# Patient Record
Sex: Female | Born: 1965 | Race: White | Hispanic: No | Marital: Married | Smoking: Current every day smoker
Health system: Southern US, Community
[De-identification: ages and names within clinical notes are randomized; demographics above are authoritative.]

## PROBLEM LIST (undated history)

## (undated) DIAGNOSIS — I2699 Other pulmonary embolism without acute cor pulmonale: Secondary | ICD-10-CM

## (undated) DIAGNOSIS — T7840XA Allergy, unspecified, initial encounter: Secondary | ICD-10-CM

## (undated) DIAGNOSIS — F17200 Nicotine dependence, unspecified, uncomplicated: Secondary | ICD-10-CM

## (undated) DIAGNOSIS — Z8742 Personal history of other diseases of the female genital tract: Secondary | ICD-10-CM

## (undated) DIAGNOSIS — J019 Acute sinusitis, unspecified: Secondary | ICD-10-CM

## (undated) DIAGNOSIS — K219 Gastro-esophageal reflux disease without esophagitis: Secondary | ICD-10-CM

## (undated) DIAGNOSIS — J309 Allergic rhinitis, unspecified: Secondary | ICD-10-CM

## (undated) HISTORY — DX: Gastro-esophageal reflux disease without esophagitis: K21.9

## (undated) HISTORY — DX: Allergy, unspecified, initial encounter: T78.40XA

## (undated) HISTORY — PX: TUBAL LIGATION: SHX77

## (undated) HISTORY — DX: Allergic rhinitis, unspecified: J30.9

## (undated) HISTORY — DX: Nicotine dependence, unspecified, uncomplicated: F17.200

## (undated) HISTORY — DX: Acute sinusitis, unspecified: J01.90

## (undated) HISTORY — DX: Other pulmonary embolism without acute cor pulmonale: I26.99

## (undated) HISTORY — DX: Personal history of other diseases of the female genital tract: Z87.42

---

## 1999-01-10 ENCOUNTER — Ambulatory Visit (HOSPITAL_COMMUNITY): Admission: RE | Admit: 1999-01-10 | Discharge: 1999-01-10 | Payer: Self-pay | Admitting: Gynecology

## 1999-08-27 ENCOUNTER — Other Ambulatory Visit: Admission: RE | Admit: 1999-08-27 | Discharge: 1999-08-27 | Payer: Self-pay | Admitting: Gynecology

## 2000-09-07 ENCOUNTER — Other Ambulatory Visit: Admission: RE | Admit: 2000-09-07 | Discharge: 2000-09-07 | Payer: Self-pay | Admitting: Gynecology

## 2001-09-29 ENCOUNTER — Other Ambulatory Visit: Admission: RE | Admit: 2001-09-29 | Discharge: 2001-09-29 | Payer: Self-pay | Admitting: Gynecology

## 2001-10-04 ENCOUNTER — Encounter: Admission: RE | Admit: 2001-10-04 | Discharge: 2001-10-04 | Payer: Self-pay | Admitting: Gynecology

## 2001-10-04 ENCOUNTER — Encounter: Payer: Self-pay | Admitting: Gynecology

## 2002-02-09 HISTORY — PX: TONSILLECTOMY: SUR1361

## 2002-02-09 HISTORY — PX: APPENDECTOMY: SHX54

## 2003-06-21 ENCOUNTER — Encounter (INDEPENDENT_AMBULATORY_CARE_PROVIDER_SITE_OTHER): Payer: Self-pay | Admitting: *Deleted

## 2003-06-21 ENCOUNTER — Inpatient Hospital Stay (HOSPITAL_COMMUNITY): Admission: EM | Admit: 2003-06-21 | Discharge: 2003-06-23 | Payer: Self-pay | Admitting: Emergency Medicine

## 2003-08-01 ENCOUNTER — Other Ambulatory Visit: Admission: RE | Admit: 2003-08-01 | Discharge: 2003-08-01 | Payer: Self-pay | Admitting: Gynecology

## 2004-09-09 ENCOUNTER — Other Ambulatory Visit: Admission: RE | Admit: 2004-09-09 | Discharge: 2004-09-09 | Payer: Self-pay | Admitting: Gynecology

## 2004-09-26 ENCOUNTER — Encounter: Admission: RE | Admit: 2004-09-26 | Discharge: 2004-09-26 | Payer: Self-pay | Admitting: Gynecology

## 2005-09-09 ENCOUNTER — Encounter: Payer: Self-pay | Admitting: Family Medicine

## 2005-09-09 LAB — CONVERTED CEMR LAB: Pap Smear: NORMAL

## 2005-09-28 ENCOUNTER — Other Ambulatory Visit: Admission: RE | Admit: 2005-09-28 | Discharge: 2005-09-28 | Payer: Self-pay | Admitting: Gynecology

## 2005-10-28 ENCOUNTER — Encounter: Admission: RE | Admit: 2005-10-28 | Discharge: 2005-10-28 | Payer: Self-pay | Admitting: Gynecology

## 2005-11-30 ENCOUNTER — Ambulatory Visit: Payer: Self-pay | Admitting: Family Medicine

## 2006-02-10 ENCOUNTER — Ambulatory Visit: Payer: Self-pay | Admitting: Family Medicine

## 2007-03-09 ENCOUNTER — Ambulatory Visit: Payer: Self-pay | Admitting: Family Medicine

## 2007-03-10 ENCOUNTER — Encounter: Payer: Self-pay | Admitting: Family Medicine

## 2007-03-10 DIAGNOSIS — L239 Allergic contact dermatitis, unspecified cause: Secondary | ICD-10-CM | POA: Insufficient documentation

## 2007-03-10 DIAGNOSIS — Z8742 Personal history of other diseases of the female genital tract: Secondary | ICD-10-CM | POA: Insufficient documentation

## 2007-03-22 ENCOUNTER — Telehealth: Payer: Self-pay | Admitting: Family Medicine

## 2007-07-11 ENCOUNTER — Encounter: Admission: RE | Admit: 2007-07-11 | Discharge: 2007-07-11 | Payer: Self-pay | Admitting: Gynecology

## 2008-11-13 ENCOUNTER — Ambulatory Visit: Payer: Self-pay | Admitting: Family Medicine

## 2008-11-29 ENCOUNTER — Encounter: Admission: RE | Admit: 2008-11-29 | Discharge: 2008-11-29 | Payer: Self-pay | Admitting: Gynecology

## 2008-11-29 IMAGING — MG MM DIAGNOSTIC BILATERAL
5 series · 5 of 5 positions shown · non-contrast
Comparison: Previous examinations.

CLINICAL DATA: Enlarging mass felt by the patient in the upper
outer left breast.  She reports that this started out as a smaller
mass in [8A].

DIGITAL DIAGNOSTIC  BILATERAL  MAMMOGRAM  WITH CAD AND LEFT BREAST
ULTRASOUND:

[R CC]
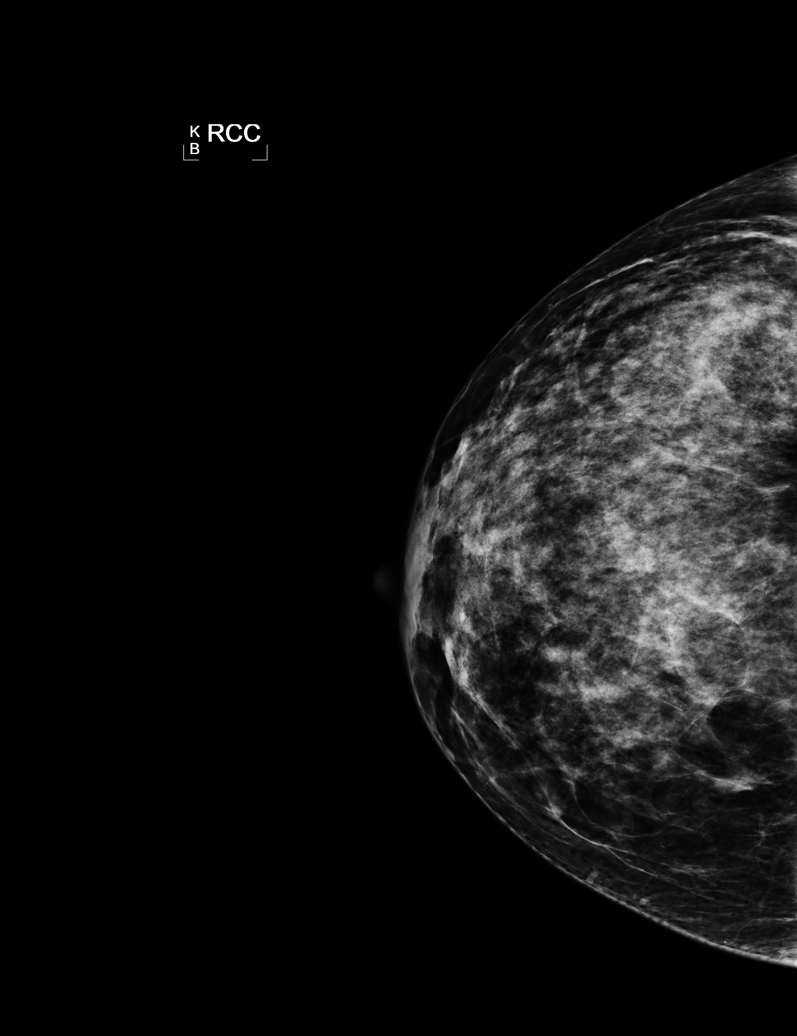

[L CC]
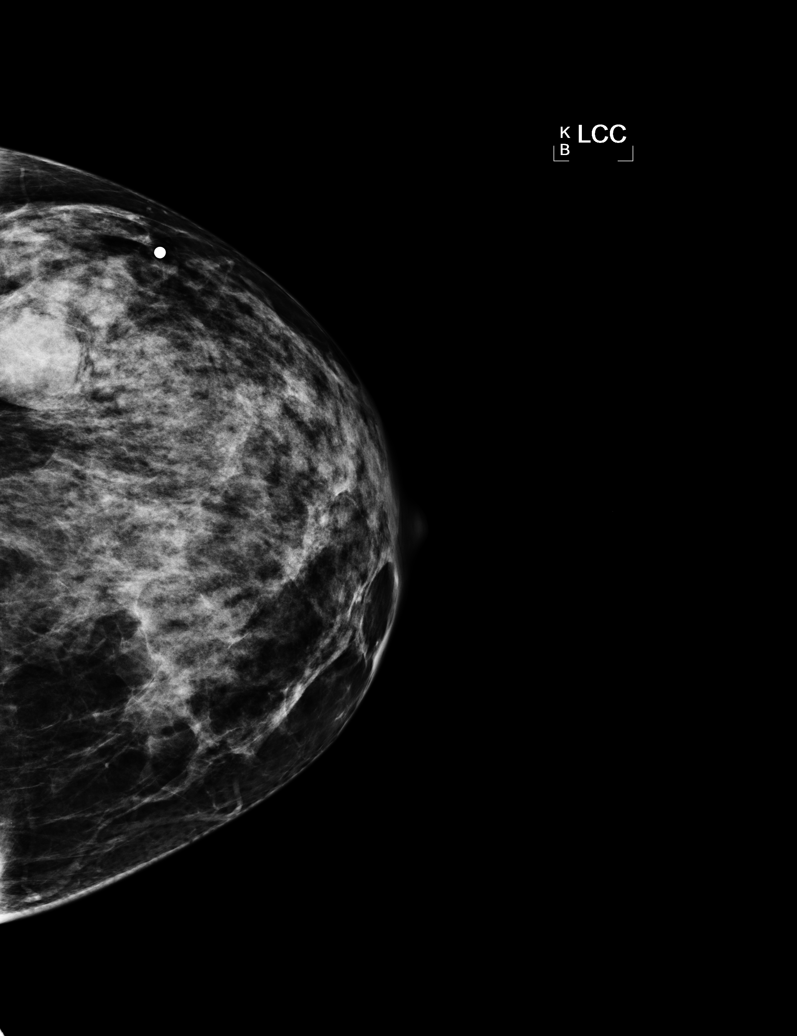

[L MLO]
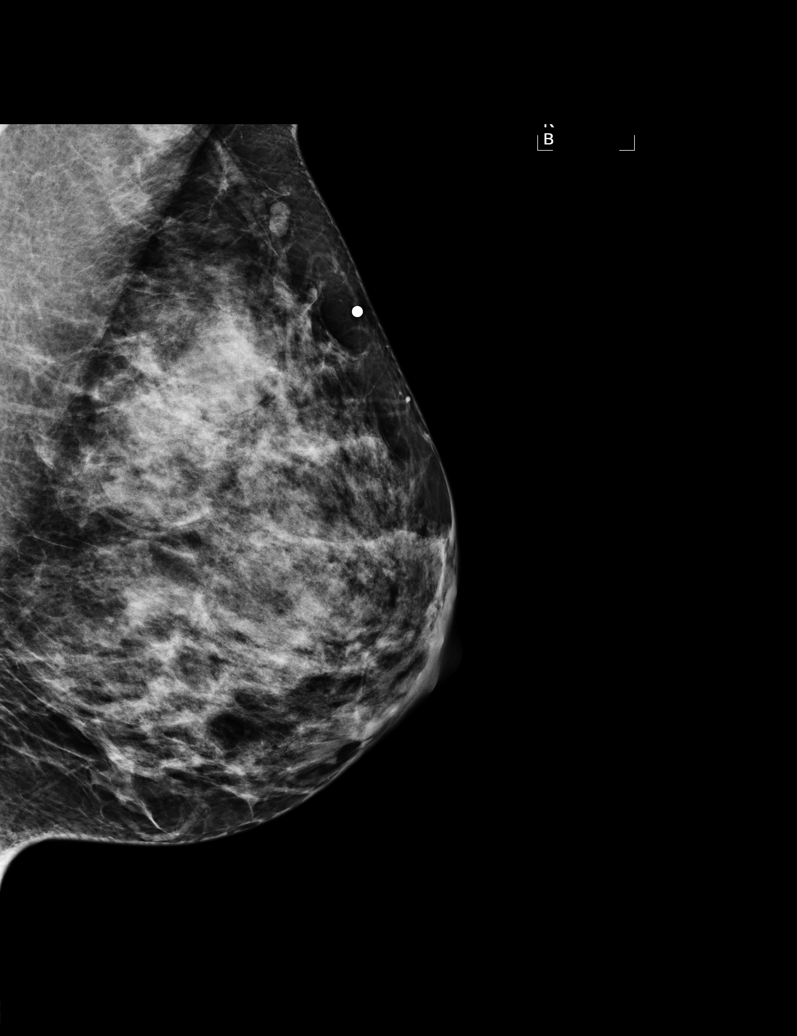

[R MLO]
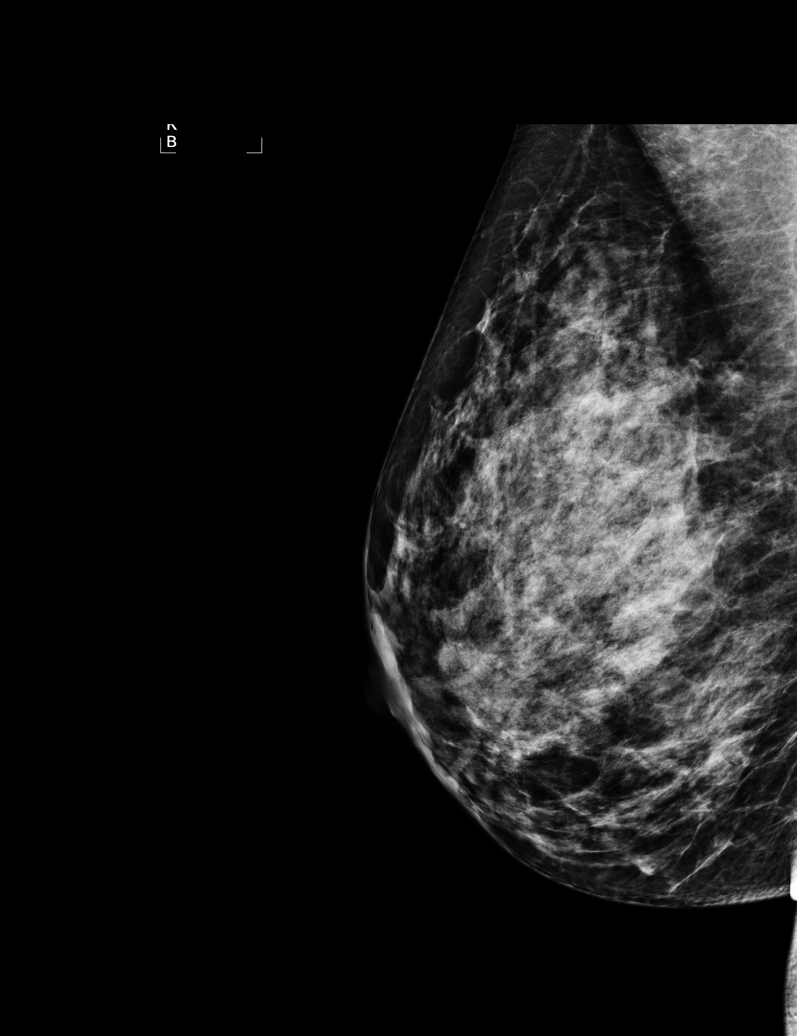

[L TAN]
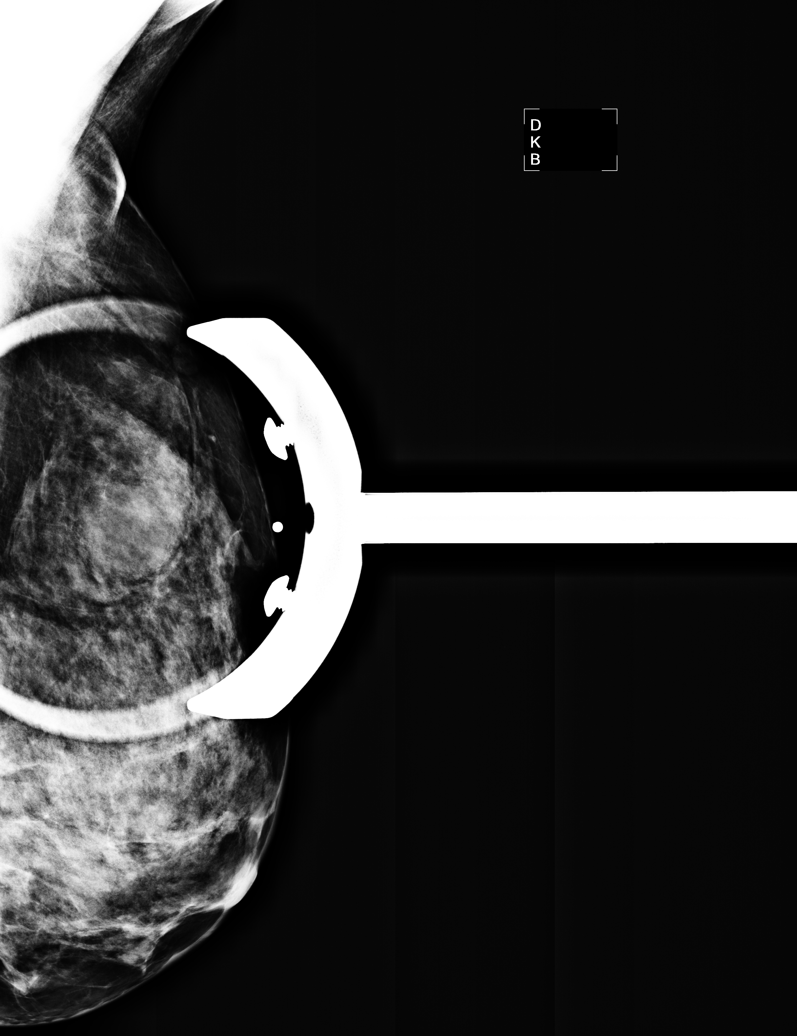

[5 of 5 positions shown; findings below may reference images not displayed]

FINDINGS: Stable heterogeneously dense parenchyma in both breasts.
Partially obscured rounded mass in the upper outer left breast,
corresponding to the palpable mass, marked with a metallic marker.
No findings suspicious for malignancy elsewhere in either breast.
Mammographic images were processed with CAD.

On physical exam, a palpable mass is confirmed in the upper outer
left breast.

Ultrasound is performed, showing a 2.3 x 1.8 x 1.4 cm cyst in the
1:30 o'clock position of the left breast, 7 cm from the nipple.
This corresponds to the palpable mass.
IMPRESSION: Left breast cyst.  No evidence of malignancy.  Screening
mammography is recommended in 1 year.

BI-RADS CATEGORY 2:  Benign finding(s).

## 2009-04-24 ENCOUNTER — Ambulatory Visit: Payer: Self-pay | Admitting: Family Medicine

## 2009-06-14 ENCOUNTER — Ambulatory Visit: Payer: Self-pay | Admitting: Family Medicine

## 2009-10-08 ENCOUNTER — Ambulatory Visit: Payer: Self-pay | Admitting: Family Medicine

## 2009-10-17 ENCOUNTER — Telehealth: Payer: Self-pay | Admitting: Family Medicine

## 2009-10-25 ENCOUNTER — Encounter: Payer: Self-pay | Admitting: Family Medicine

## 2009-11-19 ENCOUNTER — Ambulatory Visit: Payer: Self-pay | Admitting: Family Medicine

## 2009-11-25 ENCOUNTER — Encounter: Payer: Self-pay | Admitting: Family Medicine

## 2009-12-02 ENCOUNTER — Encounter: Admission: RE | Admit: 2009-12-02 | Discharge: 2009-12-02 | Payer: Self-pay | Admitting: Gynecology

## 2009-12-04 ENCOUNTER — Encounter: Payer: Self-pay | Admitting: Family Medicine

## 2009-12-16 ENCOUNTER — Inpatient Hospital Stay (HOSPITAL_COMMUNITY): Admission: EM | Admit: 2009-12-16 | Discharge: 2009-12-20 | Payer: Self-pay | Admitting: Emergency Medicine

## 2009-12-17 ENCOUNTER — Encounter (INDEPENDENT_AMBULATORY_CARE_PROVIDER_SITE_OTHER): Payer: Self-pay | Admitting: Internal Medicine

## 2009-12-23 ENCOUNTER — Encounter (INDEPENDENT_AMBULATORY_CARE_PROVIDER_SITE_OTHER): Payer: Self-pay | Admitting: *Deleted

## 2009-12-23 ENCOUNTER — Ambulatory Visit: Payer: Self-pay | Admitting: Family Medicine

## 2009-12-23 DIAGNOSIS — J13 Pneumonia due to Streptococcus pneumoniae: Secondary | ICD-10-CM | POA: Insufficient documentation

## 2009-12-23 DIAGNOSIS — Z86711 Personal history of pulmonary embolism: Secondary | ICD-10-CM | POA: Insufficient documentation

## 2009-12-23 LAB — CONVERTED CEMR LAB: Prothrombin Time: 42.4 s

## 2009-12-24 ENCOUNTER — Encounter: Payer: Self-pay | Admitting: Family Medicine

## 2009-12-26 ENCOUNTER — Ambulatory Visit: Payer: Self-pay | Admitting: Family Medicine

## 2009-12-26 LAB — CONVERTED CEMR LAB: Prothrombin Time: 39.3 s

## 2009-12-31 ENCOUNTER — Ambulatory Visit: Payer: Self-pay | Admitting: Family Medicine

## 2010-01-01 ENCOUNTER — Telehealth: Payer: Self-pay | Admitting: Family Medicine

## 2010-01-06 ENCOUNTER — Ambulatory Visit: Payer: Self-pay | Admitting: Family Medicine

## 2010-01-06 LAB — CONVERTED CEMR LAB: Prothrombin Time: 21.7 s

## 2010-01-10 ENCOUNTER — Ambulatory Visit: Payer: Self-pay | Admitting: Family Medicine

## 2010-01-10 LAB — CONVERTED CEMR LAB
INR: 1.6
Prothrombin Time: 19.2 s

## 2010-01-13 ENCOUNTER — Ambulatory Visit: Payer: Self-pay | Admitting: Family Medicine

## 2010-01-13 LAB — CONVERTED CEMR LAB
INR: 1.7
Prothrombin Time: 20.9 s

## 2010-01-14 ENCOUNTER — Encounter: Payer: Self-pay | Admitting: Family Medicine

## 2010-01-16 ENCOUNTER — Ambulatory Visit: Payer: Self-pay | Admitting: Family Medicine

## 2010-01-16 LAB — CONVERTED CEMR LAB
INR: 2.3
Prothrombin Time: 27.1 s

## 2010-01-21 ENCOUNTER — Ambulatory Visit: Payer: Self-pay | Admitting: Family Medicine

## 2010-01-21 ENCOUNTER — Encounter (INDEPENDENT_AMBULATORY_CARE_PROVIDER_SITE_OTHER): Payer: Self-pay | Admitting: *Deleted

## 2010-01-21 ENCOUNTER — Telehealth: Payer: Self-pay | Admitting: Family Medicine

## 2010-01-21 LAB — CONVERTED CEMR LAB: INR: 2.8

## 2010-02-04 ENCOUNTER — Ambulatory Visit: Payer: Self-pay | Admitting: Family Medicine

## 2010-02-13 ENCOUNTER — Ambulatory Visit
Admission: RE | Admit: 2010-02-13 | Discharge: 2010-02-13 | Payer: Self-pay | Source: Home / Self Care | Attending: Family Medicine | Admitting: Family Medicine

## 2010-02-20 ENCOUNTER — Ambulatory Visit
Admission: RE | Admit: 2010-02-20 | Discharge: 2010-02-20 | Payer: Self-pay | Source: Home / Self Care | Attending: Family Medicine | Admitting: Family Medicine

## 2010-02-20 LAB — CONVERTED CEMR LAB: Prothrombin Time: 24.5 s

## 2010-03-06 ENCOUNTER — Ambulatory Visit
Admission: RE | Admit: 2010-03-06 | Discharge: 2010-03-06 | Payer: Self-pay | Source: Home / Self Care | Attending: Family Medicine | Admitting: Family Medicine

## 2010-03-06 LAB — CONVERTED CEMR LAB: INR: 2.3

## 2010-03-13 NOTE — Progress Notes (Signed)
Summary: wants referral for pt  Phone Note Call from Patient Call back at Home Phone [phone]   Caller: Patient Call For: Dr. Para March  Summary of Call: Patient says that she is still having pain in her right arm and having tingling in her hands. She is asking if you would do referral for Physical Therapy at this time. Patient prefers [facility] and will need am appt.  Initial call taken by: Melody Comas,  October 17, 2009 10:24 AM  Follow-up for Phone Call        done Follow-up by: Crawford Givens MD,  October 17, 2009 6:06 PM

## 2010-03-13 NOTE — Miscellaneous (Signed)
Summary: D/C summary/[facility]  D/C summary/[facility]   Imported By: Lester Bruceton 01/01/2010 12:34:53  _____________________________________________________________________  External Attachment:    Type:   Image     Comment:   External Document

## 2010-03-13 NOTE — Letter (Signed)
Summary: Out of Work  [facility]  [address] [place], [place] [address]   Phone: [phone]  Fax: [phone]    December 23, 2009   Employee:  CHASTA DESHPANDE    To Whom It May Concern:   For Medical reasons, please excuse the above named employee from work for the following dates:  Please extended sick leave for 1 week until next appt until cleared by Primary care phyiscan    If you need additional information, please feel free to contact our office.         Sincerely,    [name] MD

## 2010-03-13 NOTE — Medication Information (Signed)
Summary: Order for Oxygen Concentrator/[facility]  Order for Oxygen Concentrator/[facility]   Imported By: Maryln Gottron 01/20/2010 13:58:35  _____________________________________________________________________  External Attachment:    Type:   Image     Comment:   External Document

## 2010-03-13 NOTE — Miscellaneous (Signed)
Summary: Order for PT/[facility]  Order for PT/[facility]   Imported By: Sherian Rein 11/01/2009 08:35:04  _____________________________________________________________________  External Attachment:    Type:   Image     Comment:   External Document

## 2010-03-13 NOTE — Progress Notes (Signed)
Summary: ok to take immodium?  Phone Note Call from Patient Call back at Home Phone [phone]   Caller: Patient Summary of Call: Pt went on coumadin about 2 weeks ago.  She has had watery diarrhea all day and is asking if ok if she takes immodium. Initial call taken by: Lowella Petties CMA, AAMA,  January 01, 2010 2:31 PM  Follow-up for Phone Call        that should be okay.   if the diarrhea continues or has blood in it, she needs eval.  Follow-up by: Crawford Givens MD,  January 01, 2010 2:46 PM  Additional Follow-up for Phone Call Additional follow up Details #1::        Patient Advised.  Additional Follow-up by: Delilah Shan CMA Duncan Dull),  January 01, 2010 3:42 PM

## 2010-03-13 NOTE — Assessment & Plan Note (Signed)
Summary: PAIN FROM SHOULDER DOWN TO FINGER TIPS/DLO   Vital Signs:  Patient profile:   45 year old female Height:      63.5 inches Weight:      135.25 pounds BMI:     23.67 Temp:     97.9 degrees F oral Pulse rate:   84 / minute Pulse rhythm:   regular BP sitting:   104 / 70  (left arm) Cuff size:   regular  Vitals Entered By: Delilah Shan CMA Duncan Dull) (October 08, 2009 9:14 AM) CC: Pain from shoulder, down arm, into hand on the right side.  Was seen in UC on Saturday and given Cyclobenz./Hydrocod/APAP; and IBP. (on med list)   History of Present Illness: R shoulder and arm pain.  Started with soreness in neck and shoulder about 3 weeks ago.  Pain was worse on Saturday.  On meds below from UC.  No help with muscle relaxers.  Occ tingling in R hand.  Pain meds are helping some.  R handed.  No trauma.  Types for GMAC inurance at home.  Pain reaching for mouse.   No F,C,NAV,rash.    Allergies: 1)  ! Wellbutrin (Bupropion Hcl)  Review of Systems       See HPI.  Otherwise negative.    Physical Exam  General:  A&O in NAD but uncomfortable due to sore neck and shoulder. MMM Neck w/o LA or midline pain but R trap is tender to palpation.  ROM in limited by pain, esp with turning to the right.  shoulder with decrease in ROM due to pain- esp with int/ext rotation.   Sensation intact on R arm.  Normal motor exam from elbow distally.  Normal radial pulse.  able to make composite fist. symm DTRs.    Impression & Recommendations:  Problem # 1:  SHOULDER PAIN, RIGHT (ICD-719.41) I d/w patient re: options.  Her radicular symptoms could originate at the a foramen and cause the distal tingling and muscle tightness.  She has not motor deficit and DTRs are normal.  I would proceed with sterod taper for now.  If not improving, then I would talk to her about physical therapy or imaging and referral.  She agrees.  I see no reason to image now as this would not change mgmt.  She understood and agrees.   Steroid precuations given and understood.  Out of work until pain is resolved or greatly improved.  The following medications were removed from the medication list:    Ibuprofen 800 Mg Tabs (Ibuprofen) .Marland Kitchen... Take 1 tablet by mouth three to four times per day as needed for pain. Her updated medication list for this problem includes:    Cyclobenzaprine Hcl 10 Mg Tabs (Cyclobenzaprine hcl) .Marland Kitchen... Take 1 tablet by mouth three times a day as needed for pain    Hydrocodone-acetaminophen 5-325 Mg Tabs (Hydrocodone-acetaminophen) .Marland Kitchen... Take 1 tablet by mouth every 4 hours as needed for pain  Complete Medication List: 1)  Multivitamins Tabs (Multiple vitamin) .... Take 1 tablet by mouth once a day 2)  Caltrate 600+d 600-400 Mg-unit Tabs (Calcium carbonate-vitamin d) .... Take 1 tablet by mouth once a day 3)  Loestrin 1/20 (21) 1-20 Mg-mcg Tabs (Norethindrone acet-ethinyl est) .... As directed 4)  Fluticasone Propionate 50 Mcg/act Susp (Fluticasone propionate) .... 2 sprays per nostril daily as needed 5)  Cyclobenzaprine Hcl 10 Mg Tabs (Cyclobenzaprine hcl) .... Take 1 tablet by mouth three times a day as needed for pain 6)  Hydrocodone-acetaminophen 5-325  Mg Tabs (Hydrocodone-acetaminophen) .... Take 1 tablet by mouth every 4 hours as needed for pain 7)  Prednisone (pak) 10 Mg Tabs (Prednisone) .... Sterapred ds 12 day dose pack.  take as directed with food  Patient Instructions: 1)  Take the prednisone as directed with food.  Let me know if you aren't improving so we can either set up physical therapy or image your neck.   2)  Out of work as long as typing is affected/limited by pain.  Prescriptions: PREDNISONE (PAK) 10 MG TABS (PREDNISONE) Sterapred DS 12 day dose pack.  take as directed with food  #1 pack x 0   Entered and Authorized by:   Crawford Givens MD   Signed by:   Crawford Givens MD on 10/08/2009   Method used:   Electronically to        [facility]  [address] (retail)       [address]       [place], [place]  [address]       Ph: [phone] or [phone]       Fax: [phone]   RxID:   [id number]   Current Allergies (reviewed today): ! WELLBUTRIN (BUPROPION HCL)

## 2010-03-13 NOTE — Assessment & Plan Note (Signed)
Summary: CHECK PLACE ON RIGHT SIDE/CLE   Vital Signs:  Patient profile:   45 year old female Height:      63.5 inches Weight:      132.2 pounds BMI:     23.13 Temp:     98.2 degrees F oral Pulse rate:   76 / minute Pulse rhythm:   regular BP sitting:   90 / 60  (left arm) Cuff size:   regular  Vitals Entered By: Benny Lennert CMA Duncan Dull) (April 24, 2009 11:53 AM)  History of Present Illness: Chief complaint tick bite on right   Saw tick on right abdomen ..this AM..was not present yesterday. Area mildly tender, minimal redness, no discharge. Applied alcohol.  Feels well otherwise. No jont pain, no nausea, no fever.   HAs tick with her today..nymph deer tick.   Problems Prior to Update: 1)  Diabetes Mellitus, Gestational, Hx of  (ICD-V13.29) 2)  Allergic Rhinitis  (ICD-477.9) 3)  Tobacco User  (ICD-305.1) 4)  Sinusitis- Acute-nos  (ICD-461.9)  Current Medications (verified): 1)  Allegra 180 Mg  Tabs (Fexofenadine Hcl) .... Take 1 Tablet By Mouth Once A Day 2)  Multivitamins   Tabs (Multiple Vitamin) .... Take 1 Tablet By Mouth Once A Day (Sometimes) 3)  Caltrate 600+d 600-400 Mg-Unit  Tabs (Calcium Carbonate-Vitamin D) .... Take 1 Tablet By Mouth Once A Day (Sometimes) 4)  Loestrin 1/20 (21) 1-20 Mg-Mcg Tabs (Norethindrone Acet-Ethinyl Est) .... As Directed  Allergies: 1)  ! Wellbutrin (Bupropion Hcl)  Past History:  Past medical, surgical, family and social histories (including risk factors) reviewed, and no changes noted (except as noted below).  Past Medical History: Reviewed history from 03/10/2007 and no changes required. DIABETES MELLITUS, GESTATIONAL, HX OF (ICD-V13.29) ALLERGIC RHINITIS (ICD-477.9) TOBACCO USER (ICD-305.1) SINUSITIS- ACUTE-NOS (ICD-461.9)    Past Surgical History: Reviewed history from 03/10/2007 and no changes required. 2004    Appendectomy             Tonsillectomy  Family History: Reviewed history from 03/10/2007 and no changes  required. Father: Alive 89, healthy Mother: Alive 67, healthy Siblings: 1 brother healthy, 1 sister healthy DM:  (+) MGM, aunts  Social History: Reviewed history from 03/10/2007 and no changes required. Current Smoker, less than 1 PPD x 10 years Alcohol use-yes, rare, socially, 1 x/month Drug use-no Regular exercise-yes, golf 3 x/week, walks 1 mile a day Marital Status: Married x 21 years Children: 28 son, 82 years old, healthy Occupation: Scientist, product/process development Rep for insurance (stressful) Diet:  3 meals, (+) veggies, no fruit, (+) caffeine (2 cups), (+) diet not _____, some water  ~ 32 oz./day  Physical Exam  General:  overweight appearing femal einNAd Ears:  External ear exam shows no significant lesions or deformities.  Otoscopic examination reveals clear canals, tympanic membranes are intact bilaterally without bulging, retraction, inflammation or discharge. Hearing is grossly normal bilaterally. Nose:  External nasal examination shows no deformity or inflammation. Nasal mucosa are pink and moist without lesions or exudates. Mouth:  Oral mucosa and oropharynx without lesions or exudates.  Teeth in good repair. Neck:  no carotid bruit or thyromegaly no cervical or supraclavicular lymphadenopathy  full ROM in neck Lungs:  Normal respiratory effort, chest expands symmetrically. Lungs are clear to auscultation, no crackles or wheezes. Heart:  Normal rate and regular rhythm. S1 and S2 normal without gallop, murmur, click, rub or other extra sounds. Extremities:  No clubbing, cyanosis, edema, or deformity noted with normal full range of motion of all  joints.   Skin:  left shoulder with 5 cm diameter concentric circles in target formation   Impression & Recommendations:  Problem # 1:  TICK-BORNE FEVER (ICD-066.1) Concerning for LYme disease given headache, joint ache and target skin rash near tick bote. Tick type is lone star..which does not routinely carry Lyme disease. Will eval with labs tagay and  again in 2-3 week. Will treat emperically given high pretest probability of tick borne illness.  Complete Medication List: 1)  Allegra 180 Mg Tabs (Fexofenadine hcl) .... Take 1 tablet by mouth once a day 2)  Multivitamins Tabs (Multiple vitamin) .... Take 1 tablet by mouth once a day (sometimes) 3)  Caltrate 600+d 600-400 Mg-unit Tabs (Calcium carbonate-vitamin d) .... Take 1 tablet by mouth once a day (sometimes) 4)  Loestrin 1/20 (21) 1-20 Mg-mcg Tabs (Norethindrone acet-ethinyl est) .... As directed  Current Allergies (reviewed today): ! WELLBUTRIN (BUPROPION HCL)

## 2010-03-13 NOTE — Letter (Signed)
Summary: Attending Physician Statement,[employer] Levi Aland  Attending Physician [facility] [employer]   Imported By: Beau Fanny 12/27/2009 10:44:19  _____________________________________________________________________  External Attachment:    Type:   Image     Comment:   External Document

## 2010-03-13 NOTE — Progress Notes (Signed)
Summary: oxygen  Phone Note Call from Patient Call back at Home Phone [phone]   Caller: Patient Call For: [name] MD Summary of Call: [facility] and [facility] her [facility]. They told her that you would have to fax them a letter telling them that you have taken her off of the oxygen completely before they will pick up the equipment. The fax number is [phone].  Initial call taken by: Melody Comas,  January 21, 2010 10:05 AM  Follow-up for Phone Call        Please write letter stating this and fax.  Follow-up by: [name] MD,  January 21, 2010 10:35 AM  Additional Follow-up for Phone Call Additional follow up Details #1::        Letter wrote and faxed to home care.Consuello Masse CMA   Additional Follow-up by: Benny Lennert CMA Duncan Dull),  January 21, 2010 10:44 AM

## 2010-03-13 NOTE — Assessment & Plan Note (Signed)
Summary: FLU SHOT/CLE  Nurse Visit   Allergies: 1)  ! Wellbutrin (Bupropion Hcl)  Orders Added: 1)  Admin 1st Vaccine [90471] 2)  Flu Vaccine 68yrs + [16109]   Flu Vaccine Consent Questions     Do you have a history of severe allergic reactions to this vaccine? no    Any prior history of allergic reactions to egg and/or gelatin? no    Do you have a sensitivity to the preservative Thimersol? no    Do you have a past history of Guillan-Barre Syndrome? no    Do you currently have an acute febrile illness? no    Have you ever had a severe reaction to latex? no    Vaccine information given and explained to patient? yes    Are you currently pregnant? no    Lot Number:[id number]   Exp Date:08/09/2010   Site Given  Left Deltoid IM

## 2010-03-13 NOTE — Assessment & Plan Note (Signed)
Summary: ROA 30 MIN  CYD F/U ON PNEUMONIA CYD   Vital Signs:  Patient profile:   45 year old female Height:      63.5 inches Weight:      129.0 pounds BMI:     22.57 O2 Sat:      100 % on 2 L/min Temp:     98.0 degrees F oral Pulse rate:   70 / minute Pulse rhythm:   regular BP sitting:   100 / 60  (left arm) Cuff size:   regular  Vitals Entered By: Benny Lennert CMA Duncan Dull) (January 06, 2010 8:32 AM)  O2 Flow:  2 L/min O2 Sat on room air at rest %:  100 O2 Sat on room air ambulating %:  100  History of Present Illness: Chief complaint follow up pneumonia   Hopsitalize 11/7 to 11/11 DISCHARGE DIAGNOSES: 1. Acute pulmonary embolism. 2. Pneumonia. 3. Fever secondary to pneumonia. 4. Pleuritic chest pain secondary to pulmonary embolism. 5. Leukocytosis secondary to pneumonia. 6. Tobacco abuse. 7. Recent contraceptive use. 8. Thrombocytopenia, resolved most likely secondary to acute pulmonary     embolism. 9. lupus anticoagulant positive..but not clear meaning in setting of acute PE.  ue.    Today she reports she has completed her antibitoic course and has been able to wean off pain medicaiton. She is doing better each day..still some coughing, dry..only pain onright side when taking deep breaths.  She continues on oxygen round the clock. Some fatigue. Able to speak easier now..not very breathless. Did have viral GE.Marland KitchenN/V has resolved now.   On coumadin.Marland KitchenMarland KitchenINR  intially  theraputic, but at last check 1.9 (11/22)..now off antibiotics.  Due for check today.    Problems Prior to Update: 1)  Encounter For Therapeutic Drug Monitoring  (ICD-V58.83) 2)  Long-term (CURRENT) Use of Anticoagulants  (ICD-V58.61) 3)  Pneumonia, Community Acquired, Pneumococcal  (ICD-481) 4)  Pulmonary Embolism  (ICD-415.19) 5)  Diabetes Mellitus, Gestational, Hx of  (ICD-V13.29) 6)  Allergic Rhinitis  (ICD-477.9)  Current Medications (verified): 1)  Warfarin Sodium 5 Mg Tabs (Warfarin  Sodium) .... One Tablet Daily 2)  Oxygen 2 Liters 3)  Warfarin Sodium 2.5 Mg Tabs (Warfarin Sodium) .... Take By Mouth Daily As Directed  Allergies: 1)  ! Wellbutrin (Bupropion Hcl)  Past History:  Past medical, surgical, family and social histories (including risk factors) reviewed, and no changes noted (except as noted below).  Past Medical History: Reviewed history from 03/10/2007 and no changes required. DIABETES MELLITUS, GESTATIONAL, HX OF (ICD-V13.29) ALLERGIC RHINITIS (ICD-477.9) TOBACCO USER (ICD-305.1) SINUSITIS- ACUTE-NOS (ICD-461.9)    Past Surgical History: Reviewed history from 03/10/2007 and no changes required. 2004    Appendectomy             Tonsillectomy  Family History: Reviewed history from 03/10/2007 and no changes required. Father: Alive 38, healthy Mother: Alive 1, healthy Siblings: 1 brother healthy, 1 sister healthy DM:  (+) MGM, aunts  Social History: Reviewed history from 03/10/2007 and no changes required. PAst smoker.Marland KitchenQUIT after PE 12/2009. Alcohol use-yes, rare, socially, 1 x/month Drug use-no Regular exercise-yes, golf 3 x/week, walks 1 mile a day Marital Status: Married x 21 years Children: 69 son, 56 years old, healthy Occupation: Scientist, product/process development Rep for insurance (stressful) Diet:  3 meals, (+) veggies, no fruit, (+) caffeine (2 cups), (+) diet not _____, some water  ~ 32 oz./day  Review of Systems General:  Denies fever. CV:  Denies chest pain or discomfort. Resp:  Complains of pleuritic. GI:  Denies abdominal pain. Derm:  bruising on rear resolvednow.Marland Kitchen Psych:  Denies anxiety and depression.  Physical Exam  General:  healthy appearing femal on oxygen by nasal cannula.Marland Kitchenspeaking in clear full sentences with no SOB> Eyes:  No corneal or conjunctival inflammation noted. EOMI. Perrla. Funduscopic exam benign, without hemorrhages, exudates or papilledema. Vision grossly normal. Ears:  External ear exam shows no significant lesions or  deformities.  Otoscopic examination reveals clear canals, tympanic membranes are intact bilaterally without bulging, retraction, inflammation or discharge. Hearing is grossly normal bilaterally. Nose:  External nasal examination shows no deformity or inflammation. Nasal mucosa are pink and moist without lesions or exudates. Mouth:  Oral mucosa and oropharynx without lesions or exudates.  Teeth in good repair. Neck:  no carotid bruit or thyromegaly no cervical or supraclavicular lymphadenopathy  Lungs:  Normal respiratory effort, chest expands symmetrically. Lungs are clear to auscultation, no crackles or wheezes. Heart:  Normal rate and regular rhythm. S1 and S2 normal without gallop, murmur, click, rub or other extra sounds. Abdomen:  Bowel sounds positive,abdomen soft and non-tender without masses, organomegaly or hernias noted. Pulses:  R and L posterior tibial pulses are full and equal bilaterally  Extremities:  no edema  Skin:  Intact without suspicious lesions or rashes Psych:  Cognition and judgment appear intact. Alert and cooperative with normal attention span and concentration. No apparent delusions, illusions, hallucinations   Impression & Recommendations:  Problem # 1:  PULMONARY EMBOLISM (ICD-415.19) Improving, but some residual pain and SOB. Due for INR   today.Remain on coumadin for 9 months then when off recheck lupus anticoagulant or heme referral. No longer with  oxygen requirement.  Cleared to return to work 11/30.Marland Kitchenstart back on reduced work schedule given work is a lot of talking..she still get SOB with prolongued conversations.   Her updated medication list for this problem includes:    Warfarin Sodium 5 Mg Tabs (Warfarin sodium) ..... One tablet daily    Warfarin Sodium 2.5 Mg Tabs (Warfarin sodium) .Marland Kitchen... Take by mouth daily as directed  Problem # 2:  PNEUMONIA, COMMUNITY ACQUIRED, PNEUMOCOCCAL (ICD-481) Resolved off antibiotics. The following medications were removed  from the medication list:    Avelox 400 Mg Tabs (Moxifloxacin hcl) ..... One tablet by mouth daily  Problem # 3:  CONTUSION, BUTTOCK (ICD-922.32) Resolved.   Complete Medication List: 1)  Warfarin Sodium 5 Mg Tabs (Warfarin sodium) .... One tablet daily 2)  Oxygen 2 Liters  3)  Warfarin Sodium 2.5 Mg Tabs (Warfarin sodium) .... Take by mouth daily as directed  Patient Instructions: 1)  Please schedule a follow-up appointment in 2 weeks.  2)  You are able to stop oxygen at this time..use as needed for shortness of breath and at [facility] for the next few nights. 3)  Return to work on 11/30.Marland Kitchen with restrictions to avoid long phone converstations given some residual shortness of breath. Consider starting back at reduced schedule temporarily for 1-2 days.  4)  FMLA paperwork to be sent shortly.    Orders Added: 1)  Est. Patient Level IV [57846]    Current Allergies (reviewed today): ! WELLBUTRIN (BUPROPION HCL)

## 2010-03-13 NOTE — Assessment & Plan Note (Signed)
Summary: LEFT EAR PAIN/DLO   Vital Signs:  Patient profile:   45 year old female Height:      63.5 inches Weight:      130.38 pounds BMI:     22.82 Temp:     98.1 degrees F oral Pulse rate:   72 / minute Pulse rhythm:   regular BP sitting:   120 / 70  (left arm) Cuff size:   regular  Vitals Entered By: Linde Gillis CMA Duncan Dull) (Jun 14, 2009 3:40 PM) CC: left ear pain   History of Present Illness: Intermittant left ear pain. Feels pressure, nml hearing. Itches left ear canal.  Allergies moderate control...started zyrtec..helped with symptoms except ear.  Use netty Pot.Marland Kitchen left ear popped after this.   No fever.   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 Take 1 Tablet By Mouth Once A Day (Sometimes) 2)  Caltrate 600+d 600-400 Mg-Unit  Tabs (Calcium Carbonate-Vitamin D) .... Take 1 Tablet By Mouth Once A Day (Sometimes) 3)  Loestrin 1/20 (21) 1-20 Mg-Mcg Tabs (Norethindrone Acet-Ethinyl Est) .... As Directed  Allergies: 1)  ! Wellbutrin (Bupropion Hcl)  Past History:  Past medical, surgical, family and social histories (including risk factors) reviewed, and no changes noted (except as noted below).  Past Medical History: Reviewed history from 03/10/2007 and no changes required. DIABETES MELLITUS, GESTATIONAL, HX OF (ICD-V13.29) ALLERGIC RHINITIS (ICD-477.9) TOBACCO USER (ICD-305.1) SINUSITIS- ACUTE-NOS (ICD-461.9)    Past Surgical History: Reviewed history from 03/10/2007 and no changes required. 2004    Appendectomy             Tonsillectomy  Family History: Reviewed history from 03/10/2007 and no changes required. Father: Alive 85, healthy Mother: Alive 70, healthy Siblings: 1 brother healthy, 1 sister  healthy DM:  (+) MGM, aunts  Social History: Reviewed history from 03/10/2007 and no changes required. Current Smoker, less than 1 PPD x 10 years Alcohol use-yes, rare, socially, 1 x/month Drug use-no Regular exercise-yes, golf 3 x/week, walks 1 mile a day Marital Status: Married x 21 years Children: 62 son, 23 years old, healthy Occupation: Scientist, product/process development Rep for insurance (stressful) Diet:  3 meals, (+) veggies, no fruit, (+) caffeine (2 cups), (+) diet not _____, some water  ~ 32 oz./day  Review of Systems General:  Denies fatigue and fever. CV:  Denies chest pain or discomfort. Resp:  Denies shortness of breath.  Physical Exam  General:  Well-developed,well-nourished,in no acute distress; alert,appropriate and cooperative throughout examination Eyes:  No corneal or conjunctival inflammation noted. EOMI. Perrla. Funduscopic exam benign, without hemorrhages, exudates or papilledema. Vision grossly normal. Ears:  small amount clcear fluid B TMs Nose:  External nasal examination shows no deformity or inflammation. Nasal mucosa are pink and moist without lesions or exudates. Mouth:  Oral mucosa and oropharynx without lesions or exudates.  Teeth in good repair. Neck:  no carotid bruit or thyromegaly no cervical or supraclavicular lymphadenopathy  Lungs:  Normal respiratory effort, chest expands symmetrically. Lungs are clear to auscultation, no crackles or wheezes. Heart:  Normal rate and regular rhythm. S1 and S2 normal without gallop, murmur, click, rub or other extra sounds.   Impression & Recommendations:  Problem # 1:  EAR PAIN, LEFT (ICD-388.70) Likely eustacian tube dysfunction  following allergies. Treat with nasal staroid, netty pot irrigation and antihistamine. Call if not improving as expectd.   Complete Medication List: 1)  Multivitamins Tabs (Multiple vitamin) .... Take 1 tablet by mouth once a day (sometimes) 2)  Caltrate 600+d 600-400 Mg-unit Tabs (Calcium carbonate-vitamin  d) .... Take 1 tablet by mouth once a day (sometimes) 3)  Loestrin 1/20 (21) 1-20 Mg-mcg Tabs (Norethindrone acet-ethinyl est) .... As directed 4)  Fluticasone Propionate 50 Mcg/act Susp (Fluticasone propionate) .... 2 sprays per nostril daily  Patient Instructions: 1)  NASal steroid spray 2 sprays per nostril daily x 1-2 weeks then can continue for rest of allergy season at one spray per nostril per day.  2)  Call if symptoms not resolving.  3)  Continue Zyrtec and Government social research officer.  Prescriptions: FLUTICASONE PROPIONATE 50 MCG/ACT SUSP (FLUTICASONE PROPIONATE) 2 sprays per nostril daily  #1 x 0   Entered and Authorized by:   [name] MD   Signed by:   [name] MD on 06/14/2009   Method used:   Electronically to        [facility] [address] (retail)       [address]       [place], [place]  [address]       Ph: [phone]       Fax: [phone]   RxID:   [id number]   Current Allergies (reviewed today): ! WELLBUTRIN (BUPROPION HCL)

## 2010-03-13 NOTE — Assessment & Plan Note (Signed)
Summary: 10:30 F/U [facility]  D/C 12/20/09/CLE   Vital Signs:  Patient profile:   45 year old female Height:      63.5 inches Weight:      133.0 pounds BMI:     23.27 O2 Sat:      98 % on 2 L/min Temp:     99.2 degrees F oral Pulse rate:   107 / minute Pulse rhythm:   regular Resp:     26 per minute BP sitting:   110 / 70  (left arm) Cuff size:   regular  Vitals Entered By: Benny Lennert CMA Duncan Dull) (December 23, 2009 10:28 AM)  O2 Flow:  2 L/min  History of Present Illness: Chief complaint follow up cone discharge   Hopsitalize 11/7 to 11/11 DISCHARGE DIAGNOSES: 1. Acute pulmonary embolism. 2. Pneumonia. 3. Fever secondary to pneumonia. 4. Pleuritic chest pain secondary to pulmonary embolism. 5. Leukocytosis secondary to pneumonia. 6. Tobacco abuse. 7. Recent contraceptive use. 8. Thrombocytopenia, resolved most likely secondary to acute pulmonary     embolism. 9. lupus anticoagulant positive..but not clear meaning in setting of acute PE.   ON lovenox bridge and on coumadin 5 mg daily. LAst INR 1.76 11/11 Cause thought to be due to pneumonia, smoking and OCP use...but lupus anticoag also pos.  Grandmother and uncle with ? DVT, ? PE.  Otherwise anticoag panel neg.   Recommendations to take off coumadin at 9 months and recheck lupus anticoagulant.Marland Kitchenif positive may need lifelong anticoagulation.  Now off OCP (was taking for fibroids per GYN)   Pneumonia, left lower lobe.Marland Kitchenavelox for 11m more days Currently on Oxygen, continuously. On oxycodone for pain... using 10 mg two times a day... using 5 mg intermiediated acting 2-3  a day.  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 One Tablet By Mouth Daily 2)  Warfarin Sodium 5 Mg Tabs (Warfarin Sodium) .... One Tablet Daily 3)  Oxycontin 10 Mg Xr12h-Tab (Oxycodone Hcl) .... One Tablet Every 12 Hours As Needed For Pain 4)  Oxycodone Hcl 5 Mg Tabs (Oxycodone Hcl) .Marland Kitchen.. 1 Tablet Every 4 Hours As Needed For Pain 5)  Oxygen 2 Liters  Allergies: 1)  ! Wellbutrin (Bupropion Hcl)  Past History:  Past medical, surgical, family and social histories (including risk factors) reviewed, and no changes noted (except as noted below).  Past Medical History: Reviewed history from 03/10/2007 and no changes required. DIABETES MELLITUS, GESTATIONAL, HX OF (ICD-V13.29) ALLERGIC RHINITIS (ICD-477.9) TOBACCO USER (ICD-305.1) SINUSITIS- ACUTE-NOS (ICD-461.9)    Past Surgical History: Reviewed history from 03/10/2007 and no changes required. 2004    Appendectomy             Tonsillectomy  Family History: Reviewed history from 03/10/2007 and no changes required. Father: Alive 18, healthy Mother: Alive 29, healthy Siblings: 1 brother healthy, 1 sister healthy DM:  (+) MGM, aunts  Social History: Reviewed history from 03/10/2007 and no changes required. Current Smoker, less than 1 PPD x 10 years Alcohol use-yes, rare, socially, 1 x/month Drug use-no Regular exercise-yes,  golf 3 x/week, walks 1 mile a day Marital Status: Married x 21 years Children: 56 son, 74 years old, healthy Occupation: Claims Rep for insurance (stressful) Diet:  3 meals, (+) veggies, no fruit, (+) caffeine (2 cups), (+) diet not _____, some water  ~ 32 oz./day  Review of Systems       no bleeding..unusual skin changes on low back...bruising  TTP in right SI joint General:  Denies fatigue, fever, and loss of appetite. CV:  Denies chest pain or discomfort. Resp:  Complains of  pleuritic and shortness of breath; denies sputum productive and wheezing; Shortness of breath only with moving..... GI:  Denies bloody stools. GU:  Denies dysuria.  Physical Exam  General:  Non toxic, no respiratory distress.Marland Kitchenable to speak but moderate size sentences Eyes:  No corneal or conjunctival inflammation noted. EOMI. Perrla. Funduscopic exam benign, without hemorrhages, exudates or papilledema. Vision grossly normal. Ears:  External ear exam shows no significant lesions or deformities.  Otoscopic examination reveals clear canals, tympanic membranes are intact bilaterally without bulging, retraction, inflammation or discharge. Hearing is grossly normal bilaterally. Nose:  External nasal examination shows no deformity or inflammation. Nasal mucosa are pink and moist without lesions or exudates. Mouth:  Oral mucosa and oropharynx without lesions or exudates.  Teeth in good repair. Neck:  no carotid bruit or thyromegaly no cervical or supraclavicular lymphadenopathy  Lungs:  decreased breath sounds in right base, mildly tachypnic Heart:  Normal rate and regular rhythm. S1 and S2 normal without gallop, murmur, click, rub or other extra sounds. Abdomen:  Bowel sounds positive,abdomen soft and non-tender without masses, organomegaly or hernias noted. Msk:  ttp in right SI joint..no hematoma palpated Pulses:  R and L posterior tibial pulses are full and equal bilaterally  Extremities:  no edmea Skin:  mottled bruising in tailbone buttock area. Psych:  Cognition and judgment appear intact. Alert and cooperative with normal attention span and concentration. No apparent delusions, illusions, hallucinations   Impression & Recommendations:  Problem # 1:  PNEUMONIA, COMMUNITY ACQUIRED, PNEUMOCOCCAL (ICD-481) Complete course of antibitoics. Remains O2 dependant. Wean as able per [facility].   Close follow up in 1 week.  Her updated medication list for this problem includes:    Avelox 400 Mg Tabs  (Moxifloxacin hcl) ..... One tablet by mouth daily  Problem # 2:  PULMONARY EMBOLISM (ICD-415.19) Eval PT today to determine if we can D/C Lovenow. Will remain on coumadin x 9 months, then will D/C and recehck lupus anticoagulant to determine if true positive.  Other clear reasons for PE are...OCPs, smoking, penumoni/decreased activity. Her updated medication list for this problem includes:    Warfarin Sodium 5 Mg Tabs (Warfarin sodium) ..... One tablet daily    Warfarin Sodium 2.5 Mg Tabs (Warfarin sodium) .Marland Kitchen... Take by mouth daily as directed  Orders: Protime (84132GM)  Problem # 3:  CONTUSION, BUTTOCK (ICD-922.32) Vs SI joint pain from recent deconditionating. Start with gentle stretching, heat. MAy use donut cushion. Call if pain not improving.   Complete Medication List: 1)  Avelox 400 Mg Tabs (Moxifloxacin hcl) .... One tablet by mouth daily 2)  Warfarin Sodium 5 Mg Tabs (Warfarin sodium) .... One tablet daily 3)  Oxycontin 10 Mg Xr12h-tab (Oxycodone hcl) .... One tablet every 12 hours as needed for pain 4)  Oxycodone Hcl 5 Mg Tabs (Oxycodone hcl) .Marland Kitchen.. 1 tablet every 4 hours as needed for pain 5)  Oxygen 2 Liters  6)  Warfarin Sodium 2.5 Mg Tabs (Warfarin sodium) .Marland KitchenMarland KitchenMarland Kitchen  Take by mouth daily as directed  Patient Instructions: 1)  Follow up in 1 week follow up oneumonia and PE. Will check INR at that time.  Prescriptions: WARFARIN SODIUM 2.5 MG TABS (WARFARIN SODIUM) take by mouth daily as directed  #90 x 2   Entered by:   Liane Comber CMA (AAMA)   Authorized by:   [name] MD   Signed by:   Liane Comber CMA (AAMA) on 12/26/2009   Method used:   Electronically to        [facility] [address] (retail)       [address], [address]       [place], [place]  [address]       Ph: [phone]       Fax: [phone]   RxID:   [id number] WARFARIN SODIUM 5 MG TABS (WARFARIN SODIUM) one tablet daily  #30 x 5   Entered by:   Benny Lennert CMA  (AAMA)   Authorized by:   [name] MD   Signed by:   Benny Lennert CMA (AAMA) on 12/23/2009   Method used:   Electronically to        [facility] [address] (retail)       [address], [address]       [place], [place]  [address]       Ph: [phone]       Fax: [phone]   RxID:   [id number]    Orders Added: 1)  Est. Patient Level I [99211] 2)  Protime [88416SA] 3)  Est. Patient Level IV [63016]    Current Allergies (reviewed today): ! WELLBUTRIN (BUPROPION HCL)  Laboratory Results   Blood Tests   Date/Time Recieved: December 23, 2009 11:44 AM  Date/Time Reported: December 23, 2009 11:44 AM   PT: 42.4 s   (Normal Range: 10.6-13.4)  INR: 3.5   (Normal Range: 0.88-1.12   Therap INR: 2.0-3.5)      ANTICOAGULATION RECORD  NEW REGIMEN & LAB RESULTS Anticoag. Dx: PE Current INR Goal Range: 2.5-3.5 Current INR: 3.5 Current Coumadin Dose(mg): 5mg  qd Regimen: 2.5/5/2.5 Coagulation Comments: DC'D Lovenox injections Kiegan Macaraeg: bedsole      Repeat testing in: 3days MEDICATIONS AVELOX 400 MG TABS (MOXIFLOXACIN HCL) one tablet by mouth daily WARFARIN SODIUM 5 MG TABS (WARFARIN SODIUM) one tablet daily OXYCONTIN 10 MG XR12H-TAB (OXYCODONE HCL) one tablet every 12 hours as needed for pain OXYCODONE HCL 5 MG TABS (OXYCODONE HCL) 1 tablet every 4 hours as needed for pain * OXYGEN 2 LITERS  WARFARIN SODIUM 2.5 MG TABS (WARFARIN SODIUM) take by mouth daily as directed  Dose has been reviewed with patient or caretaker during this visit.  Reviewed by: Allison Quarry  Anticoagulation Visit Questionnaire      Coumadin dose missed/changed:  No      Abnormal Bleeding Symptoms:  No Any diet changes including alcohol intake, vegetables or greens since the last visit:  No Any illnesses or hospitalizations since the last visit:  Yes      Recent Illness/Hospitalizations:  New PE x 1 week ago Any signs of clotting since the last visit (including  chest discomfort, dizziness, shortness of breath, arm tingling, slurred speech, swelling or redness in leg):  No

## 2010-03-13 NOTE — Letter (Signed)
Summary: Annual Exam/Dr. Beather Arbour  Annual Exam/Dr. Beather Arbour   Imported By: Maryln Gottron 12/11/2009 13:45:09  _____________________________________________________________________  External Attachment:    Type:   Image     Comment:   External Document  Appended Document: Orders Update    Clinical Lists Changes  Observations: Added new observation of FLUVAXDUE: 11/20/2010 (12/13/2009 14:03) Added new observation of PAP DUE: 12/05/2010 (12/13/2009 14:03) Added new observation of MAMMO DUE: 02/09/2006 (12/13/2009 14:03) Added new observation of LAST PAP DAT: 12/04/2009 (12/04/2009 14:03) Added new observation of PAP SMEAR: normal at GYN (12/04/2009 14:03)      Last PAP:  Normal (09/09/2005 2:43:37 PM) PAP Result Date:  12/04/2009 PAP Result:  normal at GYN PAP Next Due:  1 yr [name] MD  December 13, 2009 2:03 PM

## 2010-03-13 NOTE — Letter (Signed)
Summary: Generic Letter  [facility]  [address] [place], [place] [address]   Phone: [phone]  Fax: [phone]    01/21/2010  Anita White [address] [place], [place]  [address]    To Whom It May Concern:    The above named patient oxygen therapy has been discontinued as of December 13th 2011.       Sincerely,   [name] MD

## 2010-03-13 NOTE — Assessment & Plan Note (Signed)
Summary: FOLLOW-UP/JRR   Vital Signs:  Patient profile:   45 year old female Height:      63.5 inches Weight:      133.4 pounds BMI:     23.34 Temp:     98.2 degrees F oral Pulse rate:   72 / minute Pulse rhythm:   regular BP sitting:   100 / 60  (left arm) Cuff size:   regular  Vitals Entered By: Benny Lennert CMA Duncan Dull) (January 21, 2010 9:06 AM)  History of Present Illness: Chief complaint follow up   S/P resolved pneumonia and PE... on coumadin for  9 months.  No longer oxygen dependant... occ uses Oxygen at noight..but often forgets.  Back at work and doing well... able to talk on the phone.   Energy back.. no chest pain. No leg pain. Still not smoking.. now at 5 weeks.     Allergies: 1)  ! Wellbutrin (Bupropion Hcl)  Past History:  Past medical, surgical, family and social histories (including risk factors) reviewed, and no changes noted (except as noted below).  Past Medical History: Reviewed history from 03/10/2007 and no changes required. DIABETES MELLITUS, GESTATIONAL, HX OF (ICD-V13.29) ALLERGIC RHINITIS (ICD-477.9) TOBACCO USER (ICD-305.1) SINUSITIS- ACUTE-NOS (ICD-461.9)    Past Surgical History: Reviewed history from 03/10/2007 and no changes required. 2004    Appendectomy             Tonsillectomy  Family History: Reviewed history from 03/10/2007 and no changes required. Father: Alive 8, healthy Mother: Alive 48, healthy Siblings: 1 brother healthy, 1 sister healthy DM:  (+) MGM, aunts  Social History: Reviewed history from 01/06/2010 and no changes required. PAst smoker.Marland KitchenQUIT after PE 12/2009. Alcohol use-yes, rare, socially, 1 x/month Drug use-no Regular exercise-yes, golf 3 x/week, walks 1 mile a day Marital Status: Married x 21 years Children: 40 son, 51 years old, healthy Occupation: Scientist, product/process development Rep for insurance (stressful) Diet:  3 meals, (+) veggies, no fruit, (+) caffeine (2 cups), (+) diet not _____, some water  ~ 32  oz./day  Review of Systems General:  Denies fatigue and fever. CV:  Denies chest pain or discomfort. Resp:  Denies shortness of breath.  Physical Exam  General:  Well-developed,well-nourished,in no acute distress; alert,appropriate and cooperative throughout examination Nose:  External nasal examination shows no deformity or inflammation. Nasal mucosa are pink and moist without lesions or exudates. Mouth:  Oral mucosa and oropharynx without lesions or exudates.  Teeth in good repair. Neck:  No deformities, masses, or tenderness noted. no cervical or supraclavicular lymphadenopathy  Lungs:  Normal respiratory effort, chest expands symmetrically. Lungs are clear to auscultation, no crackles or wheezes. Heart:  Normal rate and regular rhythm. S1 and S2 normal without gallop, murmur, click, rub or other extra sounds. Pulses:  R and L posterior tibial pulses are full and equal bilaterally  Extremities:  no edema    Impression & Recommendations:  Problem # 1:  PULMONARY EMBOLISM (ICD-415.19) Assessment Improved Oxygenating well.  D/C oxygen. On coumadin for 9 months total... follow up in 8 months to D/C then to check lupus anticoag.  Her updated medication list for this problem includes:    Warfarin Sodium 5 Mg Tabs (Warfarin sodium) ..... One tablet daily    Warfarin Sodium 2.5 Mg Tabs (Warfarin sodium) .Marland Kitchen... Take by mouth daily as directed  Problem # 2:  PNEUMONIA, COMMUNITY ACQUIRED, PNEUMOCOCCAL (ICD-481) Assessment: Improved resolved.  Complete Medication List: 1)  Warfarin Sodium 5 Mg Tabs (Warfarin sodium) .... One tablet  daily 2)  Warfarin Sodium 2.5 Mg Tabs (Warfarin sodium) .... Take by mouth daily as directed  Patient Instructions: 1)  Look into when last tetanus done. 2)  Follow up appt in 8 months for coumadin cessation discussion.    Orders Added: 1)  Est. Patient Level III [[address]]    Current Allergies (reviewed today): ! WELLBUTRIN (BUPROPION HCL)  Last  Mammogram:  Normal (02/09/2005 2:43:07 PM) Mammogram Result Date:  12/04/2009 Mammogram Result:  normal Mammogram Next Due:  1 yr

## 2010-03-20 ENCOUNTER — Encounter: Payer: Self-pay | Admitting: Family Medicine

## 2010-03-20 ENCOUNTER — Ambulatory Visit (INDEPENDENT_AMBULATORY_CARE_PROVIDER_SITE_OTHER): Payer: 59

## 2010-03-20 DIAGNOSIS — Z7901 Long term (current) use of anticoagulants: Secondary | ICD-10-CM

## 2010-03-20 DIAGNOSIS — I2699 Other pulmonary embolism without acute cor pulmonale: Secondary | ICD-10-CM

## 2010-03-20 DIAGNOSIS — Z5181 Encounter for therapeutic drug level monitoring: Secondary | ICD-10-CM

## 2010-04-18 ENCOUNTER — Ambulatory Visit (INDEPENDENT_AMBULATORY_CARE_PROVIDER_SITE_OTHER): Payer: 59

## 2010-04-18 ENCOUNTER — Encounter: Payer: Self-pay | Admitting: Family Medicine

## 2010-04-18 DIAGNOSIS — I2699 Other pulmonary embolism without acute cor pulmonale: Secondary | ICD-10-CM

## 2010-04-18 DIAGNOSIS — Z5181 Encounter for therapeutic drug level monitoring: Secondary | ICD-10-CM

## 2010-04-18 DIAGNOSIS — Z7901 Long term (current) use of anticoagulants: Secondary | ICD-10-CM

## 2010-04-18 LAB — CONVERTED CEMR LAB: INR: 2.7

## 2010-04-22 LAB — ANTITHROMBIN III: AntiThromb III Func: 93 % (ref 76–126)

## 2010-04-22 LAB — CULTURE, BLOOD (ROUTINE X 2)
Culture  Setup Time: 201111100603
Culture  Setup Time: 201111100605
Culture: NO GROWTH

## 2010-04-22 LAB — BASIC METABOLIC PANEL
GFR calc non Af Amer: >60 mL/min (ref >60)
Potassium: 3.5 mEq/L (ref 3.5–5.1)
Sodium: 138 mEq/L (ref 135–145)

## 2010-04-22 LAB — POCT CARDIAC MARKERS
CKMB, poc: <1 ng/mL — ABNORMAL LOW (ref 1.0–8.0)
Myoglobin, poc: 53.1 ng/mL (ref 12–200)

## 2010-04-22 LAB — PROTIME-INR
INR: 0.93 (ref 0.00–1.49)
INR: 1.57 — ABNORMAL HIGH (ref 0.00–1.49)
INR: 1.61 — ABNORMAL HIGH (ref 0.00–1.49)
INR: 1.76 — ABNORMAL HIGH (ref 0.00–1.49)
Prothrombin Time: 14 seconds (ref 11.6–15.2)
Prothrombin Time: 19.3 seconds — ABNORMAL HIGH (ref 11.6–15.2)
Prothrombin Time: 20.7 seconds — ABNORMAL HIGH (ref 11.6–15.2)

## 2010-04-22 LAB — CBC
HCT: 34.5 % — ABNORMAL LOW (ref 36.0–46.0)
HCT: 39 % (ref 36.0–46.0)
Hemoglobin: 11.5 g/dL — ABNORMAL LOW (ref 12.0–15.0)
Hemoglobin: 12.5 g/dL (ref 12.0–15.0)
MCH: 30.7 pg (ref 26.0–34.0)
MCH: 31.5 pg (ref 26.0–34.0)
MCHC: 32.6 g/dL (ref 30.0–36.0)
MCHC: 33.3 g/dL (ref 30.0–36.0)
MCV: 95.1 fL (ref 78.0–100.0)
RBC: 4.1 MIL/uL (ref 3.87–5.11)
RDW: 13.3 % (ref 11.5–15.5)
RDW: 13.5 % (ref 11.5–15.5)
RDW: 13.6 % (ref 11.5–15.5)
RDW: 13.6 % (ref 11.5–15.5)
WBC: 13.3 10*3/uL — ABNORMAL HIGH (ref 4.0–10.5)
WBC: 13.7 10*3/uL — ABNORMAL HIGH (ref 4.0–10.5)
WBC: 22.6 10*3/uL — ABNORMAL HIGH (ref 4.0–10.5)

## 2010-04-22 LAB — COMPREHENSIVE METABOLIC PANEL
ALT: 13 U/L (ref 0–35)
AST: 19 U/L (ref 0–37)
Calcium: 8.4 mg/dL (ref 8.4–10.5)
Creatinine, Ser: 0.81 mg/dL (ref 0.4–1.2)
GFR calc Af Amer: >60 mL/min (ref >60)
Glucose, Bld: 122 mg/dL — ABNORMAL HIGH (ref 70–99)
Sodium: 132 mEq/L — ABNORMAL LOW (ref 135–145)
Total Protein: 6.5 g/dL (ref 6.0–8.3)

## 2010-04-22 LAB — POCT PREGNANCY, URINE: Preg Test, Ur: NEGATIVE

## 2010-04-22 LAB — LIPID PANEL
Cholesterol: 185 mg/dL (ref 0–200)
LDL Cholesterol: 111 mg/dL — ABNORMAL HIGH (ref 0–99)
Triglycerides: 136 mg/dL (ref <150)
VLDL: 27 mg/dL (ref 0–40)

## 2010-04-22 LAB — CARDIOLIPIN ANTIBODIES, IGG, IGM, IGA: Anticardiolipin IgM: 2 MPL U/mL — ABNORMAL LOW (ref <11)

## 2010-04-22 LAB — APTT: aPTT: 34 seconds (ref 24–37)

## 2010-04-22 LAB — PROTEIN S ACTIVITY: Protein S Activity: 55 % — ABNORMAL LOW (ref 69–129)

## 2010-04-22 LAB — BETA-2-GLYCOPROTEIN I ABS, IGG/M/A
Beta-2-Glycoprotein I IgA: 1 A Units (ref <20)
Beta-2-Glycoprotein I IgM: 0 M Units (ref <20)

## 2010-04-22 LAB — LUPUS ANTICOAGULANT PANEL
Lupus Anticoagulant: DETECTED — AB
PTTLA Confirmation: 30.6 secs — ABNORMAL HIGH (ref <8.0)
dRVVT Incubated 1:1 Mix: 43.9 secs (ref 36.2–44.3)

## 2010-04-22 LAB — DIFFERENTIAL
Eosinophils Absolute: 0.1 10*3/uL (ref 0.0–0.7)
Lymphocytes Relative: 24 % (ref 12–46)

## 2010-04-22 LAB — PROTEIN S, TOTAL: Protein S Ag, Total: 92 % (ref 70–140)

## 2010-04-22 LAB — URINALYSIS, ROUTINE W REFLEX MICROSCOPIC
Bilirubin Urine: NEGATIVE
Glucose, UA: NEGATIVE mg/dL
Ketones, ur: NEGATIVE mg/dL
Nitrite: NEGATIVE
pH: 6 (ref 5.0–8.0)

## 2010-04-22 LAB — CARDIAC PANEL(CRET KIN+CKTOT+MB+TROPI)
CK, MB: 0.6 ng/mL (ref 0.3–4.0)
CK, MB: 0.7 ng/mL (ref 0.3–4.0)
Total CK: 72 U/L (ref 7–177)
Troponin I: 0.01 ng/mL (ref 0.00–0.06)
Troponin I: 0.02 ng/mL (ref 0.00–0.06)

## 2010-04-22 LAB — PROTEIN C, TOTAL: Protein C, Total: 94 % (ref 70–140)

## 2010-04-22 LAB — TROPONIN I: Troponin I: 0.01 ng/mL (ref 0.00–0.06)

## 2010-04-22 LAB — CK TOTAL AND CKMB (NOT AT ARMC): Total CK: 55 U/L (ref 7–177)

## 2010-04-22 LAB — TSH: TSH: 3.907 u[IU]/mL (ref 0.350–4.500)

## 2010-04-22 LAB — FACTOR 5 LEIDEN

## 2010-04-22 NOTE — Medication Information (Signed)
Summary: protime/alc   Indication 1: PE PT 31.9 INR RANGE 2.5-3.5           Allergies: 1)  ! Wellbutrin (Bupropion Hcl)  Anticoagulation Management History:      Negative risk factors for bleeding include an age less than 45 years old.  The bleeding index is 'low risk'.  Negative CHADS2 values include Age > 76 years old.  Her last INR was 3.2 and today's INR is 2.7.  Prothrombin time is 31.9.    Anticoagulation Management Assessment/Plan:      The patient's current anticoagulation dose is Warfarin sodium 5 mg tabs: one tablet daily, Warfarin sodium 2.5 mg tabs: take by mouth daily as directed, Warfarin sodium 10 mg tabs: take one tablet daily as directed.  The next INR is due 4 weeks.         ANTICOAGULATION RECORD PREVIOUS REGIMEN & LAB RESULTS Anticoagulation Diagnosis:  PE on  12/23/2009 Previous INR Goal Range:  2.5-3.5 on  12/23/2009 Previous INR:  3.2 on  03/20/2010 Previous Coumadin Dose(mg):  10 mg daily on  03/20/2010 Previous Regimen:  10mg  qd on  02/20/2010 Previous Coagulation Comments:  . on  01/06/2010  NEW REGIMEN & LAB RESULTS Current INR: 2.7 Regimen: 10mg  qd  (no change)  Provider: Zelene Barga Repeat testing in: 4 weeks Other Comments: Patient has petechiae looking spots on both legs from the knee down. She is concerend. Please advise.  Dose has been reviewed with patient or caretaker during this visit. Reviewed by: Celso Sickle  Anticoagulation Visit Questionnaire Coumadin dose missed/changed:  No Abnormal Bleeding Symptoms:  No  Any diet changes including alcohol intake, vegetables or greens since the last visit:  No Any illnesses or hospitalizations since the last visit:  No Any signs of clotting since the last visit (including chest discomfort, dizziness, shortness of breath, arm tingling, slurred speech, swelling or redness in leg):  No  MEDICATIONS WARFARIN SODIUM 5 MG TABS (WARFARIN SODIUM) one tablet daily WARFARIN SODIUM 2.5 MG TABS  (WARFARIN SODIUM) take by mouth daily as directed WARFARIN SODIUM 10 MG TABS (WARFARIN SODIUM) take one tablet daily as directed    Laboratory Results   Blood Tests   Date/Time Recieved: April 18, 2010 2:05 PM  Date/Time Reported: April 18, 2010 2:05 PM   PT: 31.9 s   (Normal Range: 10.6-13.4)  INR: 2.7   (Normal Range: 0.88-1.12   Therap INR: 2.0-3.5)      ANTICOAGULATION RECORD PREVIOUS REGIMEN & LAB RESULTS Anticoagulation Diagnosis:  PE on  12/23/2009 Previous INR Goal Range:  2.5-3.5 on  12/23/2009 Previous INR:  3.2 on  03/20/2010 Previous Coumadin Dose(mg):  10 mg daily on  03/20/2010 Previous Regimen:  10mg  qd on  02/20/2010 Previous Coagulation Comments:  . on  01/06/2010  NEW REGIMEN & LAB RESULTS Current INR: 2.7 Regimen: 10mg  qd  (no change)       Repeat testing in: 4 weeks MEDICATIONS WARFARIN SODIUM 5 MG TABS (WARFARIN SODIUM) one tablet daily WARFARIN SODIUM 2.5 MG TABS (WARFARIN SODIUM) take by mouth daily as directed WARFARIN SODIUM 10 MG TABS (WARFARIN SODIUM) take one tablet daily as directed   Anticoagulation Visit Questionnaire      Coumadin dose missed/changed:  No      Abnormal Bleeding Symptoms:  No

## 2010-05-07 ENCOUNTER — Telehealth: Payer: Self-pay | Admitting: Radiology

## 2010-05-07 DIAGNOSIS — Z7901 Long term (current) use of anticoagulants: Secondary | ICD-10-CM

## 2010-05-07 DIAGNOSIS — Z5181 Encounter for therapeutic drug level monitoring: Secondary | ICD-10-CM

## 2010-05-07 DIAGNOSIS — I2699 Other pulmonary embolism without acute cor pulmonale: Secondary | ICD-10-CM

## 2010-05-07 NOTE — Telephone Encounter (Signed)
Order for INR

## 2010-05-15 ENCOUNTER — Ambulatory Visit: Payer: 59

## 2010-05-15 ENCOUNTER — Ambulatory Visit (INDEPENDENT_AMBULATORY_CARE_PROVIDER_SITE_OTHER): Payer: 59 | Admitting: Family Medicine

## 2010-05-15 DIAGNOSIS — Z7901 Long term (current) use of anticoagulants: Secondary | ICD-10-CM

## 2010-05-15 DIAGNOSIS — I2699 Other pulmonary embolism without acute cor pulmonale: Secondary | ICD-10-CM

## 2010-05-15 DIAGNOSIS — Z5181 Encounter for therapeutic drug level monitoring: Secondary | ICD-10-CM

## 2010-05-15 LAB — POCT INR: INR: 2.8

## 2010-05-15 NOTE — Patient Instructions (Signed)
Continue current dose, check in 4 weeks  

## 2010-06-13 ENCOUNTER — Ambulatory Visit (INDEPENDENT_AMBULATORY_CARE_PROVIDER_SITE_OTHER): Payer: 59 | Admitting: Family Medicine

## 2010-06-13 DIAGNOSIS — Z5181 Encounter for therapeutic drug level monitoring: Secondary | ICD-10-CM

## 2010-06-13 DIAGNOSIS — Z7901 Long term (current) use of anticoagulants: Secondary | ICD-10-CM

## 2010-06-13 DIAGNOSIS — I2699 Other pulmonary embolism without acute cor pulmonale: Secondary | ICD-10-CM

## 2010-06-27 NOTE — Assessment & Plan Note (Signed)
HEALTHCARE                             [facility] OFFICE NOTE   Anita White, Anita White                     MRN:          [id number]  DATE:11/30/2005                            DOB:          10/29/1965    CHIEF COMPLAINT:  A 45 year old white female here to establish new doctor.   HISTORY OF PRESENT ILLNESS:  Anita White is switching from [facility] because she has moved closer to our practice. The only concern she  has today is recurrence of her yearly allergic symptoms. She reports sore  throat and post-nasal drip. In the past she has taken Allegra during the  fall season and it has controlled her symptoms 100%.  She denies sneezing,  itchy eyes, but does have lots of congestion.   REVIEW OF SYSTEMS:  No headache. No dizziness. No syncope. Wears contacts.  No hearing problems. No dyspnea. No chest pain. No palpitations. No nausea,  vomiting, diarrhea, constipation or rectal bleeding. Last menstrual period  was October 14 and was regular. She has occasional hip pain that she was  told was possibly bursitis and takes Advil occasionally with good relief.   PAST MEDICAL HISTORY:  1. Allergic rhinitis.  2. History of gestational diabetes.  3. Tobacco abuse.   HOSPITALIZATIONS, SURGERIES, PROCEDURES:  1. In 2004, appendectomy.  2. Tonsillectomy.  3. Mammogram September 2007, was negative.  4. Pap smear August 2007, negative.   ALLERGIES:  WELLBUTRIN CAUSES HIVES.   MEDICATIONS:  1. Calcium daily.  2. Multivitamin daily.  3. Chantix 1 mg p.o. b.i.d.   FAMILY HISTORY:  Father alive at age 47, healthy; mother alive at age 74,  healthy. One brother and one sister who are healthy. There is a maternal  grandmother and some aunts have diabetes. There is no family history of any  type of cancer.   SOCIAL HISTORY:  She smokes just less than 1 pack per day and has done so  for the past 10 years. She drinks alcohol about one time  per month socially.  She denies any other drug use. 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 She has been married for 21 years and denies domestic  abuse. She has one son who is 4 years old and healthy. She plays golf about  three times per week, but when she is not doing that walks about 1 mile per  day. She eats three meals a day with lots of vegetables, but does not like  fruit. She does drink some caffeine and diet [facility]. She has about 32  ounces of water a day.   PHYSICAL EXAMINATION:  VITAL SIGNS: Height: 63-1/2 inches. Weight: 119,  making BMI about 22. Blood pressure: 104/70. Pulse: 76. Temperature: 98.0.  GENERAL: Healthy-appearing female in no apparent distress.  HEENT: PERRLA. Extraocular muscles intact. Oropharynx clear. Tympanic  membranes clear. Nares with slightly inflamed turbinates bilaterally. No  thyromegaly. No lymphadenopathy-cervical or supraclavicular.  PULMONARY: Clear to auscultation bilaterally. No wheezing, rales or rhonchi.  CARDIOVASCULAR: Regular rate and rhythm. No murmurs, rubs or gallops. Normal  PMI. 2+ peripheral pulses. No peripheral edema.  ABDOMEN: Soft and nontender. Normoactive bowel sounds. No  hepatosplenomegaly.  MUSCULOSKELETAL: Good range of motion of shoulders bilaterally, full range  of motion of bilateral hips. Strength 5/5 in upper and lower extremities.  NEURO: Alert and oriented x3. Cranial nerves II-XII grossly intact.   ASSESSMENT/PLAN:  1. Allergic rhinitis: The patient was given a prescription for Allegra 180      mg daily. I encouraged her to use the Chantix that was given to her by      her gynecologist to quit smoking as it is likely worsening her      allergies.  2. Prevention: I will get records from her previous doctor to determine      when her tetanus was last done. She will also return for a flu vaccine      once she gets her voucher from work. She appear up-to-date with other       prevention and health maintenance. She did have a cholesterol checked      at her gynecologist within the last 1-2 years and it was normal.      [name], MD    AB/MedQ  DD:  11/30/2005  DT:  11/30/2005  Job #:  [id number]

## 2010-06-27 NOTE — H&P (Signed)
NAME:  BRENNEN, GARDINER                        ACCOUNT NO.:  [id number]   MEDICAL RECORD NO.:  [id number]                   PATIENT TYPE:  INP   LOCATION:  1824                                 FACILITY:  [facility]   PHYSICIAN:  Velora Heckler, M.D.                DATE OF BIRTH:  1965/03/15   DATE OF ADMISSION:  06/21/2003  DATE OF DISCHARGE:                                HISTORY & PHYSICAL   REASON FOR ADMISSION:  Acute appendicitis.   BRIEF HISTORY:  The patient is a 45 year old white female who presents to  the emergency department with a 24-hour history of abdominal pain.  Pain was  initially periumbilical and localized to the right lower quadrant.  She  developed chills.  She has had episodes of nausea and emesis.  She had  diarrheal stools on the day prior to admission.  She has become anorectic  and has had nothing by mouth today.  The patient presented to the emergency  department where she was seen by the emergency room physician.  Laboratory  studies showed an elevated white blood cell count of 14,700 with a left  shift.  CT scan of the abdomen and pelvis was obtained which confirmed  findings consistent with acute appendicitis.  General surgery is now called  for management.   PAST MEDICAL HISTORY:  1. Status post bilateral tubal ligation.  2. Status post tonsillectomy.  3. History of uterine fibroid tumors.   MEDICATIONS:  Allegra for seasonal allergies.   ALLERGIES:  WELLBUTRIN (causing hives).   SOCIAL HISTORY:  The patient is accompanied by her husband.  They have 1 son  age 47 years.  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  She  smokes a pack of cigarettes a day.  She drinks alcohol on occasion.  She  denies illicit drug use.   FAMILY HISTORY:  No family history of adverse reactions to surgery or  anesthesia.   FIFTEEN SYSTEM REVIEW:  Without significant other positives except as noted  above.   PHYSICAL EXAMINATION:  GENERAL:  A 45 year old  well-developed, well-  nourished white female on a stretcher in the emergency department.  VITAL SIGNS:  Show a temperature of 102.5, blood pressure 138/53, pulse 105,  respirations 18, O2 saturation 98% on room air.  HEENT:  Shows her to be normocephalic, atraumatic.  Sclerae are clear.  Conjunctivae are clear.  Pupils are equal and reactive.  Dentition is good.  Mucous membranes are moist.  NECK:  Supple without mass.  Thyroid is normal without nodularity, there is  no anterior  posterior cervical lymphadenopathy.  LUNGS:  Clear to auscultation without rales, rhonchi or wheezes.  CARDIAC EXAM:  Shows regular rate and rhythm without murmur.  Peripheral  pulse are full.  ABDOMEN:  Soft, mild distention, no bowel sounds on auscultation. There are  no surgical wounds.  There has been a previous piercing at the  umbilicus.  There is tenderness to palpation and percussion particularly in the lower  quadrants, and maximum at McBurney's point.  There is voluntary guarding.  There is rebound tenderness.  There are no masses.  There is no sign of  hernia.  EXTREMITIES:  Are nontender without edema.  NEUROLOGIC:  The patient is alert and oriented and without focal deficit.   LABORATORY STUDIES:  White count 14.8 thousand, hemoglobin 14.4, hematocrit  41.7%, platelet count 284,000, differential shows 84% neutrophile's, 10%  lymphocytes.  Chemistry profile is within normal limits.  Urinalysis is  benign.  Urine pregnancy test is negative.   RADIOGRAPHIC STUDIES:  CT scan abdomen and pelvis reviewed on the monitor in  the emergency department shows findings consistent with acute appendicitis.   IMPRESSION:  Acute appendicitis.   PLAN:  The patient will be admitted to the general surgical service at [facility].  Antibiotics will be initiated.  Operating room has been  contacted and the patient will be called for as soon as possible for  laparoscopic appendectomy.  Risk and benefits of  the procedure have been  discussed with the patient and her husband.  Risk of conversion to open  surgery has been discussed.  Length of surgery and length of hospitalization  have been discussed.  Recovery time has also been addressed.  The patient  and her husband understand and wish to proceed.  Will make arrangements  immediately with the operating room.                                                Velora Heckler, M.D.    TMG/MEDQ  D:  06/21/2003  T:  06/21/2003  Job:  [id number]

## 2010-06-27 NOTE — Op Note (Signed)
NAME:  Anita White, Anita White                        ACCOUNT NO.:  [id number]   MEDICAL RECORD NO.:  [id number]                   PATIENT TYPE:  INP   LOCATION:  1824                                 FACILITY:  [facility]   PHYSICIAN:  Velora Heckler, M.D.                DATE OF BIRTH:  [birth year]   DATE OF PROCEDURE:  06/21/2003  DATE OF DISCHARGE:                                 OPERATIVE REPORT   PREOPERATIVE DIAGNOSIS:  Acute appendicitis.   POSTOPERATIVE DIAGNOSIS:  Acute appendicitis.   PROCEDURE:  Laparoscopic appendectomy.   SURGEON:  Velora Heckler, M.D.   ANESTHESIA:  General.   ESTIMATED BLOOD LOSS:  Minimal.   PREPARATION:  Betadine.   COMPLICATIONS:  None.   INDICATIONS:  The patient is a 45 year old white female who presents to the  emergency department with a 24-hour history of abdominal pain localized to  the right lower quadrant.  This was associated with nausea, vomiting, and  diarrhea.  The patient had developed chills.  She was noted to be febrile in  the emergency department.  White blood cell count was elevated at greater  than 14,000 with a left shift.  CT scan of the abdomen confirmed acute  appendicitis.  The patient now comes to surgery for appendectomy.   FINDINGS:  Contained perforation at the base of the appendix with acute  inflammatory changes surrounding the cecum and terminal ileum.   BODY OF REPORT:  The procedure was done in OR #16 at the [facility].  The patient was brought to the operating room and placed  in a supine position on the operating room table.  Following administration  of general anesthesia, the patient is prepped and draped in the usual strict  aseptic fashion.  After ascertaining that an adequate level of anesthesia  had been obtained, an infraumbilical incision is made with a #15 blade  through the previous surgical scar.  Dissection is carried down to the  fascia.  The fascia is incised in the midline, the  peritoneal cavity is  entered cautiously.  A 0 Vicryl pursestring suture is placed in the fascia.  A Hasson cannula is introduced under direct vision and secured with the  pursestring suture.  The abdomen is insufflated with carbon dioxide.  A  laparoscope is introduced and the abdomen is explored.  Operative ports are  placed on the right upper quadrant and left lower quadrant.  Cecum is  mobilized.  There is an acute inflammatory reaction surrounding the terminal  ileum and cecum.  There are dense inflammatory adhesions.  There is cloudy  fluid present in the right lower quadrant and pelvis.  With gentle blunt  dissection and mobilization, the cecum is rolled medially.  Densely adherent  to the base of the cecum and up the lateral colic gutter is an acutely  inflamed appendix with fibrinous exudate.  Adhesions are gently broken  down  with blunt dissection, and the appendix is mobilized off the cecum.  Fatty  tissue of the terminal ileum is mobilized off of the base of the appendix,  and there is a contained perforation near the base of the appendix with  greenish debris present.  The mesoappendix is carefully divided using the  Harmonic scalpel for hemostasis.  Dissection is carried down to the base of  the appendix.  Base of the appendix is dissected out circumferentially from  the cecal wall.  An Endo-GIA stapler is inserted and gently placed across  the base of the appendix just below the level of the perforation.  A small  portion of the cecal wall is included in the grasp of the stapler, and the  stapler is closed.  The stapler is fired and the base of the appendix is  transected.  The appendix itself is placed into an EndoCatch bag and  withdrawn through the left lower quadrant port.  The staple line on the  cecal wall appears to be intact with good approximation.  There is good  hemostasis.  The right lower quadrant is copiously irrigated with warm  saline, which is evacuated.  The  pelvis is irrigated with warm saline, which  is evacuated.  Good hemostasis is noted.  Omentum is positioned over the  base of the cecum.  Ports are removed under direct vision and good  hemostasis is noted at the port sites.  The umbilical port is removed and  the 0 Vicryl pursestring suture is tied securely after evacuating the  pneumoperitoneum.  Wounds are anesthetized with local anesthetic.  All three  wounds are closed with interrupted 4-0 Vicryl subcuticular sutures.  The  wounds are washed and dried and Benzoin and Steri-Strips are applied,  sterile dressings are applied.  The patient is awakened from anesthesia and  brought to the recovery room in stable condition.  The patient tolerated the  procedure well.                                               Velora Heckler, M.D.    TMG/MEDQ  D:  06/21/2003  T:  06/23/2003  Job:  [id number]

## 2010-07-11 ENCOUNTER — Ambulatory Visit (INDEPENDENT_AMBULATORY_CARE_PROVIDER_SITE_OTHER): Payer: 59 | Admitting: Family Medicine

## 2010-07-11 DIAGNOSIS — Z5181 Encounter for therapeutic drug level monitoring: Secondary | ICD-10-CM

## 2010-07-11 DIAGNOSIS — I2699 Other pulmonary embolism without acute cor pulmonale: Secondary | ICD-10-CM

## 2010-07-11 DIAGNOSIS — Z7901 Long term (current) use of anticoagulants: Secondary | ICD-10-CM

## 2010-07-11 NOTE — Patient Instructions (Signed)
Continue current dose and return in 4 weeks.  

## 2010-08-05 ENCOUNTER — Encounter: Payer: Self-pay | Admitting: Family Medicine

## 2010-08-06 ENCOUNTER — Encounter: Payer: Self-pay | Admitting: Family Medicine

## 2010-08-06 ENCOUNTER — Ambulatory Visit (INDEPENDENT_AMBULATORY_CARE_PROVIDER_SITE_OTHER): Payer: 59 | Admitting: Family Medicine

## 2010-08-06 VITALS — BP 100/70 | HR 84 | Temp 98.3°F | Ht 63.0 in | Wt 145.4 lb

## 2010-08-06 DIAGNOSIS — F17200 Nicotine dependence, unspecified, uncomplicated: Secondary | ICD-10-CM

## 2010-08-06 DIAGNOSIS — I2699 Other pulmonary embolism without acute cor pulmonale: Secondary | ICD-10-CM

## 2010-08-06 DIAGNOSIS — J069 Acute upper respiratory infection, unspecified: Secondary | ICD-10-CM

## 2010-08-06 DIAGNOSIS — Z720 Tobacco use: Secondary | ICD-10-CM

## 2010-08-06 MED ORDER — HYDROCODONE-HOMATROPINE 5-1.5 MG/5ML PO SYRP: ORAL_SOLUTION | ORAL | Status: DC

## 2010-08-06 MED ORDER — BENZONATATE 100 MG PO CAPS: 100.0000 mg | ORAL_CAPSULE | Freq: Three times a day (TID) | ORAL | Status: AC | PRN

## 2010-08-06 NOTE — Progress Notes (Signed)
Anita White, a 45 y.o. female presents today in the office for the following:    Hx significant for PE in 12/2009 and on Coumadin  Patent presents with runny nose, sneezing, cough, sore throat, malaise and minimal / low-grade fever .   + recent exposure to others with similar symptoms.   The patent denies sore throat as the primary complaint. Denies sthortness of breath/wheezing, high fever, chest pain, rhinits for more than 14 days, significant myalgia, otalgia, facial pain, abdominal pain, changes in bowel or bladder.  PMH, PHS, Allergies, Problem List, Medications, Family History, and Social History have all been reviewed.  ROS: as above, eating and drinking - tolerating PO. Urinating normally. No excessive vomitting or diarrhea. O/w as above.  PHYSICAL EXAM  Blood pressure 100/70, pulse 84, temperature 98.3 F (36.8 C), temperature source Oral, height 5\' 3"  (1.6 m), weight 145 lb 6.4 oz (65.953 kg), SpO2 99.00%.  PE: GEN: WDWN, Non-toxic, Atraumatic, normocephalic. A and O x 3. HEENT: Oropharynx clear without exudate, MMM, no significant LAD, mild rhinnorhea Ears: TM clear, COL visualized with good landmarks CV: RRR, no m/g/r. Pulm: CTA B, no wheezes, rhonchi, or crackles, normal respiratory effort. EXT: no c/c/e Psych: well oriented, neither depressed nor anxious in appearance  A/P: 1. URI. Supportive care reviewed with patient. See patient instruction section. Tessalon and Hycodan 2. PE: hx complicated by - meds all reviewed, interactions reviewed 3. TOB: as above, ventolin trial while sick

## 2010-08-06 NOTE — Patient Instructions (Signed)
Tessalon prn during day At night if cough bad -- can take cough syrup  2 puffs inhaler as needed during day if coughing or SOB

## 2010-08-11 ENCOUNTER — Ambulatory Visit (INDEPENDENT_AMBULATORY_CARE_PROVIDER_SITE_OTHER): Payer: 59 | Admitting: Family Medicine

## 2010-08-11 ENCOUNTER — Telehealth: Payer: Self-pay | Admitting: Radiology

## 2010-08-11 DIAGNOSIS — I2699 Other pulmonary embolism without acute cor pulmonale: Secondary | ICD-10-CM

## 2010-08-11 DIAGNOSIS — Z7901 Long term (current) use of anticoagulants: Secondary | ICD-10-CM

## 2010-08-11 DIAGNOSIS — Z5181 Encounter for therapeutic drug level monitoring: Secondary | ICD-10-CM

## 2010-08-11 LAB — POCT INR: INR: 2.6

## 2010-08-11 NOTE — Telephone Encounter (Signed)
Patient is due for testing in Aug,to check wether she needs to stay on Warfarin. Can you order these tests ?Her next INR is 09-15-2010.

## 2010-08-11 NOTE — Patient Instructions (Signed)
Continue current dose, check in 4 weeks  

## 2010-08-12 NOTE — Telephone Encounter (Signed)
Patient advised and will stop warfarin after 09-15-2010 and then schedule lupus test at that appt

## 2010-08-12 NOTE — Telephone Encounter (Signed)
What we need to do is have her go ahead and stop warfarin in August then have her return 1-2 weeks after to check lupus anticoagulant OFF the medication.  If this is positive again we will then need to refer her to hematology and likely restart her on anticoagulation lifelong

## 2010-09-15 ENCOUNTER — Ambulatory Visit (INDEPENDENT_AMBULATORY_CARE_PROVIDER_SITE_OTHER): Payer: 59 | Admitting: Family Medicine

## 2010-09-15 DIAGNOSIS — I2699 Other pulmonary embolism without acute cor pulmonale: Secondary | ICD-10-CM

## 2010-09-15 DIAGNOSIS — Z7901 Long term (current) use of anticoagulants: Secondary | ICD-10-CM

## 2010-09-15 DIAGNOSIS — Z5181 Encounter for therapeutic drug level monitoring: Secondary | ICD-10-CM

## 2010-09-15 LAB — POCT INR: INR: 3.2

## 2010-09-15 NOTE — Patient Instructions (Signed)
Will hold coumadin x 2 weeks , then have labs done to see if she is able to come off coumadin.

## 2010-09-29 ENCOUNTER — Other Ambulatory Visit (INDEPENDENT_AMBULATORY_CARE_PROVIDER_SITE_OTHER): Payer: 59 | Admitting: Family Medicine

## 2010-09-29 DIAGNOSIS — I2699 Other pulmonary embolism without acute cor pulmonale: Secondary | ICD-10-CM

## 2010-09-30 LAB — LUPUS ANTICOAGULANT PANEL: DRVVT: 36.6 secs (ref 34.1–42.2)

## 2010-10-02 ENCOUNTER — Ambulatory Visit: Payer: Self-pay | Admitting: Family Medicine

## 2010-10-28 ENCOUNTER — Other Ambulatory Visit: Payer: Self-pay | Admitting: Gynecology

## 2010-10-28 DIAGNOSIS — Z1231 Encounter for screening mammogram for malignant neoplasm of breast: Secondary | ICD-10-CM

## 2010-12-04 ENCOUNTER — Ambulatory Visit
Admission: RE | Admit: 2010-12-04 | Discharge: 2010-12-04 | Disposition: A | Discharge status: Home / Self Care | Payer: 59 | Source: Ambulatory Visit | Attending: Gynecology | Admitting: Gynecology

## 2010-12-04 DIAGNOSIS — Z1231 Encounter for screening mammogram for malignant neoplasm of breast: Secondary | ICD-10-CM

## 2010-12-11 ENCOUNTER — Ambulatory Visit (INDEPENDENT_AMBULATORY_CARE_PROVIDER_SITE_OTHER): Payer: 59

## 2010-12-11 DIAGNOSIS — Z23 Encounter for immunization: Secondary | ICD-10-CM

## 2010-12-24 ENCOUNTER — Other Ambulatory Visit: Payer: Self-pay | Admitting: Gynecology

## 2010-12-30 ENCOUNTER — Encounter: Payer: Self-pay | Admitting: Family Medicine

## 2010-12-30 ENCOUNTER — Ambulatory Visit (INDEPENDENT_AMBULATORY_CARE_PROVIDER_SITE_OTHER): Payer: 59 | Admitting: Family Medicine

## 2010-12-30 VITALS — BP 120/78 | HR 80 | Temp 97.8°F | Ht 64.0 in | Wt 144.0 lb

## 2010-12-30 DIAGNOSIS — M999 Biomechanical lesion, unspecified: Secondary | ICD-10-CM

## 2010-12-30 DIAGNOSIS — M533 Sacrococcygeal disorders, not elsewhere classified: Secondary | ICD-10-CM

## 2010-12-30 DIAGNOSIS — M549 Dorsalgia, unspecified: Secondary | ICD-10-CM

## 2010-12-30 NOTE — Patient Instructions (Signed)
Sacroiliac Joint Mobilization and Rehab 1. Work on pretzel stretching, shoulder back and leg draped in front. 3-5 sets, 30 sec.. 2. hip abductor rotations. standing, hip flexion and rotation outward then inward. 3 sets, 15 reps. when can do comfortably, add ankle weights starting at 2 pounds.  3. cross over stretching - shoulder back to ground, same side leg crossover. 3-5 sets for 30 min..  4. rolling up and back knees to chest and rocking. 5. sacral tilt - 5 sets, hold for 5-10 seconds  

## 2010-12-30 NOTE — Progress Notes (Signed)
Patient Name: Anita White Date of Birth: [birth year] Age: 45 y.o. Medical Record Number: [id number] Gender: female  History of Present Illness:  Anita White is a 45 y.o. very pleasant female patient who presents with the following:  Back has been bothering her for a while. Felt tired with a little bit of low back pain. Now cannot sit for a very long time. Trouble getting up out of bed. Sleeping in the recliner. Went to [employer]. Acute LBP.   Fell down deck steps - about two years ago. Back has been bothering for about 7-8 months.   Had some muscle relaxers at home. Took some advil also. At times her right leg or toes will feel numb at times. No weakness.  Few months - occ urination with coughing.   Is basically having only on the right side with no pain on the left. No radiculopathy. No bowel or bladder incontinence. No saddle anesthesia.  Past Medical History, Surgical History, Social History, Family History, and Problem List have been reviewed in EHR and updated if relevant.  Review of Systems:  GEN: No fevers, chills. Nontoxic. Primarily MSK c/o today. MSK: Detailed in the HPI GI: tolerating PO intake without difficulty Neuro: No numbness, parasthesias, or tingling associated. Otherwise the pertinent positives of the ROS are noted above.    Physical Examination: Filed Vitals:   12/30/10 1129  BP: 120/78  Pulse: 80  Temp: 97.8 F (36.6 C)  TempSrc: Oral  Height: 5\' 4"  (1.626 m)  Weight: 144 lb (65.318 kg)  SpO2: 100%     GEN: Well-developed,well-nourished,in no acute distress; alert,appropriate and cooperative throughout examination HEENT: Normocephalic and atraumatic without obvious abnormalities. Ears, externally no deformities PULM: Breathing comfortably in no respiratory distress EXT: No clubbing, cyanosis, or edema PSYCH: Normally interactive. Cooperative during the interview. Pleasant. Friendly and conversant. Not anxious or depressed appearing.  Normal, full affect.  Range of motion at  the waist: Flexion: normal Extension: normal Lateral bending: normal Rotation: all normal  No echymosis or edema Rises to examination table with no difficulty Gait: non antalgic  Inspection/Deformity: N Paraspinus Tenderness: n  B Ankle Dorsiflexion (L5,4): 5/5 B Great Toe Dorsiflexion (L5,4): 5/5 Heel Walk (L5): WNL Toe Walk (S1): WNL Rise/Squat (L4): WNL  SENSORY B Medial Foot (L4): WNL B Dorsum (L5): WNL B Lateral (S1): WNL Light Touch: WNL Pinprick: WNL  REFLEXES Knee (L4): 2+ Ankle (S1): 2+  B SLR, seated: neg B SLR, supine: neg B FABER: neg B Reverse FABER: pos on R B Greater Troch: NT B Log Roll: neg B Stork: NT B Sciatic Notch: TTP on the R   Assessment and Plan:  1. Sacroiliac dysfunction  Ambulatory referral to Physical Therapy  2. Back pain  Ambulatory referral to Physical Therapy   >25 minutes spent in face to face time with patient, >50% spent in counselling or coordination of care:  anatomy and rehabilitation of the following condition reviewed:  Sacroilitis  A rehabilitation program was given to the patient emphasizing increasing ROM at [facility] joint, increasing overall hip and piriformis flexibility, and hip flexor and abductor strength.  Part of this was given from P/I section and another handout custom made for patient.  Could benefit from PT - referred for formal PT  Orders Placed This Encounter  Procedures  . Ambulatory referral to Physical Therapy    Referral Priority:  Routine    Referral Type:  Physical Medicine    Referral Reason:  Specialty Services Required  Requested Specialty:  Physical Therapy    Number of Visits Requested:  1     Medications Discontinued During This Encounter  Medication Reason  . warfarin (COUMADIN) 10 MG tablet   . warfarin (COUMADIN) 2.5 MG tablet   . warfarin (COUMADIN) 5 MG tablet   . HYDROcodone-homatropine (HYCODAN) 5-1.5 MG/5ML syrup

## 2011-01-16 ENCOUNTER — Encounter: Payer: Self-pay | Admitting: Family Medicine

## 2011-05-20 ENCOUNTER — Ambulatory Visit (INDEPENDENT_AMBULATORY_CARE_PROVIDER_SITE_OTHER): Payer: 59 | Admitting: Family Medicine

## 2011-05-20 ENCOUNTER — Encounter: Payer: Self-pay | Admitting: Family Medicine

## 2011-05-20 VITALS — BP 90/60 | HR 72 | Temp 98.3°F | Ht 64.0 in | Wt 143.8 lb

## 2011-05-20 DIAGNOSIS — J301 Allergic rhinitis due to pollen: Secondary | ICD-10-CM

## 2011-05-20 DIAGNOSIS — J0100 Acute maxillary sinusitis, unspecified: Secondary | ICD-10-CM

## 2011-05-20 MED ORDER — AMOXICILLIN 500 MG PO CAPS: 1000.0000 mg | ORAL_CAPSULE | Freq: Two times a day (BID) | ORAL | Status: AC

## 2011-05-20 MED ORDER — FLUTICASONE PROPIONATE 50 MCG/ACT NA SUSP: 2.0000 | Freq: Every day | NASAL | Status: DC

## 2011-05-20 NOTE — Progress Notes (Signed)
  Patient Name: Anita White Date of Birth: January 01, 1966 Age: 46 y.o. Medical Record Number: [id number] Gender: female Date of Encounter: 05/20/2011  History of Present Illness:  Anita White is a 46 y.o. very pleasant female patient who presents with the following:  Has had some allergy medication, has been feeling bad and for over a week. Coughing and congested, close to 2 weeks. No pain, congested a lot in her nose, no real coughing and drainage. Eyes are watering a lot. L eye felt scratchy and itchy. Has been feeling very tired and fatigued. Now, feels even worse.   Allegra -- started in Feb.  Some facial discomfort  Past Medical History, Surgical History, Social History, Family History, Problem List, Medications, and Allergies have been reviewed and updated if relevant.  Review of Systems: ROS: GEN: Acute illness details above GI: Tolerating PO intake GU: maintaining adequate hydration and urination Pulm: No SOB Interactive and getting along well at home.  Otherwise, ROS is as per the HPI.   Physical Examination: Filed Vitals:   05/20/11 1124  BP: 90/60  Pulse: 72  Temp: 98.3 F (36.8 C)  TempSrc: Oral  Height: 5\' 4"  (1.626 m)  Weight: 143 lb 12.8 oz (65.227 kg)  SpO2: 98%    Body mass index is 24.68 kg/(m^2).   Gen: WDWN, NAD; alert,appropriate and cooperative throughout exam  HEENT: Normocephalic and atraumatic. Throat clear, w/o exudate, no LAD, R TM clear, L TM - good landmarks, No fluid present. rhinnorhea.  Left frontal and maxillary sinuses: Tender, max Right frontal and maxillary sinuses: Tendermax  Neck: No ant or post LAD CV: RRR, No M/G/R Pulm: Breathing comfortably in no resp distress. no w/c/r Abd: S,NT,ND,+BS Extr: no c/c/e Psych: full affect, pleasant   Assessment and Plan:  1. Allergic rhinitis due to pollen   2. Sinusitis, acute maxillary    Worsened allergies leading to sinusitis. Add Flonase to regimen. High-dose  amoxicillin.  Orders Today: No orders of the defined types were placed in this encounter.    Medications Today: Meds ordered this encounter  Medications  . amoxicillin (AMOXIL) 500 MG capsule    Sig: Take 2 capsules (1,000 mg total) by mouth 2 (two) times daily.    Dispense:  40 capsule    Refill:  0  . fluticasone (FLONASE) 50 MCG/ACT nasal spray    Sig: Place 2 sprays into the nose daily.    Dispense:  16 g    Refill:  12

## 2011-05-20 NOTE — Patient Instructions (Signed)
Genteal - lubricating drops

## 2011-06-10 ENCOUNTER — Ambulatory Visit: Payer: 59 | Admitting: Family Medicine

## 2011-06-10 ENCOUNTER — Telehealth: Payer: Self-pay | Admitting: Family Medicine

## 2011-06-10 ENCOUNTER — Emergency Department (HOSPITAL_COMMUNITY)
Admission: EM | Admit: 2011-06-10 | Discharge: 2011-06-10 | Disposition: A | Discharge status: Home / Self Care | Payer: 59 | Attending: Emergency Medicine | Admitting: Emergency Medicine

## 2011-06-10 ENCOUNTER — Encounter (HOSPITAL_COMMUNITY): Payer: Self-pay | Admitting: *Deleted

## 2011-06-10 DIAGNOSIS — M7989 Other specified soft tissue disorders: Secondary | ICD-10-CM

## 2011-06-10 DIAGNOSIS — R609 Edema, unspecified: Secondary | ICD-10-CM | POA: Insufficient documentation

## 2011-06-10 DIAGNOSIS — R202 Paresthesia of skin: Secondary | ICD-10-CM

## 2011-06-10 DIAGNOSIS — R209 Unspecified disturbances of skin sensation: Secondary | ICD-10-CM | POA: Insufficient documentation

## 2011-06-10 NOTE — ED Notes (Signed)
Pt has been having swelling and tingling in her left foot since Friday and in her left hand since yesterday.  Pt was going to be seen at her PCP office but was told to come to ER due to her hx of blood clots, she had a PE in her right lung in 12/2009.  No sob or CP with this

## 2011-06-10 NOTE — Discharge Instructions (Signed)
Please call your primary care provider today to schedule a close follow up appointment.  Read the information below.  You may return to the ER at any time for worsening condition or any new symptoms that concern you.   Edema Edema is an abnormal build-up of fluids in tissues. Because this is partly dependent on gravity (water flows to the lowest place), it is more common in the leg sand thighs (lower extremities). It is also common in the looser tissues, like around the eyes. Painless swelling of the feet and ankles is common and increases as a person ages. It may affect both legs and may include the calves or even thighs. When squeezed, the fluid may move out of the affected area and may leave a dent for a few moments. CAUSES   Prolonged standing or sitting in one place for extended periods of time. Movement helps pump tissue fluid into the veins, and absence of movement prevents this, resulting in edema.   Varicose veins. The valves in the veins do not work as well as they should. This causes fluid to leak into the tissues.   Fluid and salt overload.   Injury, burn, or surgery to the leg, ankle, or foot, may damage veins and allow fluid to leak out.   Sunburn damages vessels. Leaky vessels allow fluid to go out into the sunburned tissues.   Allergies (from insect bites or stings, medications or chemicals) cause swelling by allowing vessels to become leaky.   Protein in the blood helps keep fluid in your vessels. Low protein, as in malnutrition, allows fluid to leak out.   Hormonal changes, including pregnancy and menstruation, cause fluid retention. This fluid may leak out of vessels and cause edema.   Medications that cause fluid retention. Examples are sex hormones, blood pressure medications, steroid treatment, or anti-depressants.   Some illnesses cause edema, especially heart failure, kidney disease, or liver disease.   Surgery that cuts veins or lymph nodes, such as surgery done  for the heart or for breast cancer, may result in edema.  DIAGNOSIS  Your caregiver is usually easily able to determine what is causing your swelling (edema) by simply asking what is wrong (getting a history) and examining you (doing a physical). Sometimes x-rays, EKG (electrocardiogram or heart tracing), and blood work may be done to evaluate for underlying medical illness. TREATMENT  General treatment includes:  Leg elevation (or elevation of the affected body part).   Restriction of fluid intake.   Prevention of fluid overload.   Compression of the affected body part. Compression with elastic bandages or support stockings squeezes the tissues, preventing fluid from entering and forcing it back into the blood vessels.   Diuretics (also called water pills or fluid pills) pull fluid out of your body in the form of increased urination. These are effective in reducing the swelling, but can have side effects and must be used only under your caregiver's supervision. Diuretics are appropriate only for some types of edema.  The specific treatment can be directed at any underlying causes discovered. Heart, liver, or kidney disease should be treated appropriately. HOME CARE INSTRUCTIONS   Elevate the legs (or affected body part) above the level of the heart, while lying down.   Avoid sitting or standing still for prolonged periods of time.   Avoid putting anything directly under the knees when lying down, and do not wear constricting clothing or garters on the upper legs.   Exercising the legs causes the fluid  to work back into the veins and lymphatic channels. This may help the swelling go down.   The pressure applied by elastic bandages or support stockings can help reduce ankle swelling.   A low-salt diet may help reduce fluid retention and decrease the ankle swelling.   Take any medications exactly as prescribed.  SEEK MEDICAL CARE IF:  Your edema is not responding to recommended  treatments. SEEK IMMEDIATE MEDICAL CARE IF:   You develop shortness of breath or chest pain.   You cannot breathe when you lay down; or if, while lying down, you have to get up and go to the window to get your breath.   You are having increasing swelling without relief from treatment.   You develop a fever over 102 F (38.9 C).   You develop pain or redness in the areas that are swollen.   Tell your caregiver right away if you have gained 3 lb/1.4 kg in 1 day or 5 lb/2.3 kg in a week.  MAKE SURE YOU:   Understand these instructions.   Will watch your condition.   Will get help right away if you are not doing well or get worse.  Document Released: 01/26/2005 Document Revised: 01/15/2011 Document Reviewed: 09/14/2007 [facility] Patient Information 2012 [place], [place].  Paresthesia Paresthesia is an abnormal burning or prickling sensation. This sensation is generally felt in the hands, arms, legs, or feet. However, it may occur in any part of the body. It is usually not painful. The feeling may be described as:  Tingling or numbness.   "Pins and needles."   Skin crawling.   Buzzing.   Limbs "falling asleep."   Itching.  Most people experience temporary (transient) paresthesia at some time in their lives. CAUSES  Paresthesia may occur when you breathe too quickly (hyperventilation). It can also occur without any apparent cause. Commonly, paresthesia occurs when pressure is placed on a nerve. The feeling quickly goes away once the pressure is removed. For some people, however, paresthesia is a long-lasting (chronic) condition caused by an underlying disorder. The underlying disorder may be:  A traumatic, direct injury to nerves. Examples include a:   Broken (fractured) neck.   Fractured skull.   A disorder affecting the brain and spinal cord (central nervous system). Examples include:   Transverse myelitis.   Encephalitis.   Transient ischemic attack.   Multiple  sclerosis.   Stroke.   Tumor or blood vessel problems, such as an arteriovenous malformation pressing against the brain or spinal cord.   A condition that damages the peripheral nerves (peripheral neuropathy). Peripheral nerves are not part of the brain and spinal cord. These conditions include:   Diabetes.   Peripheral vascular disease.   Nerve entrapment syndromes, such as carpal tunnel syndrome.   Shingles.   Hypothyroidism.   Vitamin B12 deficiencies.   Alcoholism.   Heavy metal poisoning (lead, arsenic).   Rheumatoid arthritis.   Systemic lupus erythematosus.  DIAGNOSIS  Your caregiver will attempt to find the underlying cause of your paresthesia. Your caregiver may:  Take your medical history.   Perform a physical exam.   Order various lab tests.   Order imaging tests.  TREATMENT  Treatment for paresthesia depends on the underlying cause. HOME CARE INSTRUCTIONS  Avoid drinking alcohol.   You may consider massage or acupuncture to help relieve your symptoms.   Keep all follow-up appointments as directed by your caregiver.  SEEK IMMEDIATE MEDICAL CARE IF:   You feel weak.   You  have trouble walking or moving.   You have problems with speech or vision.   You feel confused.   You cannot control your bladder or bowel movements.   You feel numbness after an injury.   You faint.   Your burning or prickling feeling gets worse when walking.   You have pain, cramps, or dizziness.   You develop a rash.  MAKE SURE YOU:  Understand these instructions.   Will watch your condition.   Will get help right away if you are not doing well or get worse.  Document Released: 01/16/2002 Document Revised: 01/15/2011 Document Reviewed: 10/17/2010 [facility] Patient Information 2012 [place], [place].

## 2011-06-10 NOTE — Telephone Encounter (Signed)
aller: Anita White/Patient; PCP: Excell Seltzer.; CB#: [phone]; ; ; Call regarding Having Swelling in Left Foot and Ankle and Hand;  Pt is calling to say that her left foot and hand have been a mild swelling. Onset on Friday 06/05/11 for 1 day - she noticed her shoe was tight on the left side. Yesterday am it occurred again and also this am. Yesterday her foot went numb with tingling intermittently for a short period of time. Both ankles have been stiff and sore in the am when she first gets up. Pts left  hand has been swollen and stiff upon arising and  is numb today intermittently - but she woke up having slept on it wrong . Pt felt dizzy last night for several seconds and had a h/a yesterday evening that responded to tylenol.  Rn triaged and advised and scheduled appt but because of hx,. Rn called the office to speak with Rena, LPN who checked with MD. Due to pts hx she was advised to ED. Pt will go to [facility] ED.

## 2011-06-10 NOTE — Progress Notes (Signed)
*  PRELIMINARY RESULTS* Vascular Ultrasound Left upper extremity venous duplex has been completed.   No evidence of left upper extremity or right subclavian vein deep vein thrombosis. All veins are compressible.   Left lower extremity venous duplex has been completed. No evidence of left lower extremity or right common femoral vein deep vein thrombosis. All veins are compressible.   Malachy Moan, RDMS, RDCS 06/10/2011, 12:38 PM

## 2011-06-10 NOTE — Telephone Encounter (Signed)
Anita White with [facility] said 06/05/11 pt had left hand numbness, left foot numbness with swelling which has continued on and off. Pt had h/a with dizziness for brief period last night with relief with Tylenol. Pt can move limbs OK, no chest pain, no difficulty breathing, no speech changes, and no confusion. When pt awakes both ankles feel stiff and sore. Pt already has appt today with Dr Dayton Martes at 10 :15 but 12/209 pt had blood clot right lung with Coumadin therapy for 1 year. Dr Dayton Martes said pt should go to ER for evaluation and possible imaging. Anita White said she will contact pt to go to ER now and cancel appt with Dr Dayton Martes after speaking with pt. Anita White will note which ER pt will go to.

## 2011-06-10 NOTE — Telephone Encounter (Signed)
aller: Juanita/Patient; PCP: Excell Seltzer.; CB#: [phone]; ; ; Call regarding Having Swelling in Left Foot and Ankle and Hand;  Pt is calling to say that her left foot and hand have been a mild swelling. Onset on Friday 06/05/11 for 1 day - she noticed her shoe was tight on the left side. Yesterday am it occurred again and also this am. Yesterday her foot went numb with tingling intermittently for a short period of time. Both ankles have been stiff and sore in the am when she first gets up. Pts left  hand has been swollen and stiff upon arising and  is numb today intermittently - but she woke up having slept on it wrong . Pt felt dizzy last night for several seconds and had a h/a yesterday evening that responded to tylenol.  Rn triaged and advised an appt. Appt scheduled at 10:15. Pt on her way to the office.

## 2011-06-10 NOTE — Telephone Encounter (Signed)
Noted. Agree with ED disposition  °

## 2011-06-10 NOTE — ED Provider Notes (Signed)
2:10 PM Patient moved to CDU for doppler US of LUE and LLE.  Sign out received from Dr Alto Denver.  Plan is for Doppler US and d/c home with PCP follow up if negative.  Preliminary results are negative.  Patient reports left foot and left hand tingling and swelling.  Foot is currently without swelling.  Discussed results with patient and family member and advised close follow up with PCP.  Patient verbalizes understanding and agrees with plan.    Dillard Cannon [place], [place] 06/10/11 [phone]

## 2011-06-10 NOTE — ED Provider Notes (Signed)
History   This chart was scribed for Anita Numbers, MD by Melba Coon. The patient was seen in room STRE2/STRE2 and the patient's care was started at 11:03AM.    CSN: [id number]  Arrival date & time 06/10/11  1017   First MD Initiated Contact with Patient 06/10/11 1102      Chief Complaint  Patient presents with  . Tingling    (Consider location/radiation/quality/duration/timing/severity/associated sxs/prior treatment) HPI Anita White is a 46 y.o. female who presents to the Emergency Department complaining of constant, moderate to severe left arm and left foot tingling with associated swelling an onset 5 days ago. This is confined to the area from the left wrist distally and from the left ankle distallyPt thought it was the shoes she was wearing but then it started in her arms. PCP told her to come in and check for blood clots. Hx of blood clots in lungs in 2011. Pt was on coumadin until 2012. No HA, fever, neck pain, sore throat, rash, back pain, CP, SOB, abd pain, n/v/d, dysuria, or extremity pain, edema, or weakness. No known allergies. No other pertinent medical symptoms.  PCP: Dr. Claud Kelp  Past Medical History  Diagnosis Date  . Personal history of other genital system and obstetric disorders   . Allergic rhinitis, cause unspecified   . Tobacco use disorder   . Acute sinusitis, unspecified     Past Surgical History  Procedure Date  . Appendectomy 2004  . Tonsillectomy 2004    History reviewed. No pertinent family history.  History  Substance Use Topics  . Smoking status: Former Smoker    Quit date: 12/10/2009  . Smokeless tobacco: Not on file  . Alcohol Use: Yes    OB History    Grav Para Term Preterm Abortions TAB SAB Ect Mult Living                  Review of Systems  Constitutional: Negative for fever and chills.  Respiratory: Negative for shortness of breath.   Gastrointestinal: Negative for nausea and vomiting.  Neurological: Negative  for weakness.   10 Systems reviewed and all are negative for acute change except as noted in the HPI.   Allergies  Bupropion hcl  Home Medications   Current Outpatient Rx  Name Route Sig Dispense Refill  . ASPIRIN EC 81 MG PO TBEC Oral Take 81 mg by mouth daily.    Marland Kitchen VITAMIN D 1000 UNITS PO TABS Oral Take 1,000 Units by mouth daily.    Marland Kitchen FLUTICASONE PROPIONATE 50 MCG/ACT NA SUSP Each Nare Place 2 sprays into both nostrils daily as needed. For allergies    . MAGNESIUM PO Oral Take 1 tablet by mouth daily.    Marland Kitchen VITAMIN C 500 MG PO TABS Oral Take 500 mg by mouth daily.      BP 110/68  Pulse 68  Temp(Src) 97.5 F (36.4 C) (Oral)  Resp 16  SpO2 99%  Physical Exam  Nursing note and vitals reviewed. Constitutional: She is oriented to person, place, and time. She appears well-developed and well-nourished. No distress.  HENT:  Head: Normocephalic and atraumatic.  Eyes: EOM are normal.  Neck: Normal range of motion. Neck supple. No tracheal deviation present.  Cardiovascular: Normal rate, regular rhythm and normal heart sounds.  Exam reveals no gallop and no friction rub.   No murmur heard. Pulmonary/Chest: Effort normal and breath sounds normal. No respiratory distress. She has no wheezes. She has no rales.  Abdominal: Soft.  Bowel sounds are normal. There is no tenderness.  Musculoskeletal: Normal range of motion.  Neurological: She is alert and oriented to person, place, and time.  Skin: Skin is warm and dry.  Psychiatric: She has a normal mood and affect. Her behavior is normal.    ED Course  Procedures (including critical care time)  DIAGNOSTIC STUDIES: Oxygen Saturation is 99% on room air, normal by my interpretation.    COORDINATION OF CARE:  11:08AM - EDMD will order  ultrasound.  Labs Reviewed - No data to display No results found.   No diagnosis found.    MDM  Patient and I discussed that her symptoms were not consistent with a mjor neurologic malady and  that I felt they were unlikely to be consistent with thromboembolic disease.  Given patient history and PCP referral as well as patient concern we will order LUE and LLE venous duplex studies.  Patient will move to CDU to await results.  I personally performed the services described in this documentation, which was scribed in my presence. The recorded information has been reviewed and considered.          Anita Numbers, MD 06/10/11 1152

## 2011-06-11 ENCOUNTER — Ambulatory Visit (INDEPENDENT_AMBULATORY_CARE_PROVIDER_SITE_OTHER): Payer: 59 | Admitting: Family Medicine

## 2011-06-11 ENCOUNTER — Encounter: Payer: Self-pay | Admitting: Family Medicine

## 2011-06-11 VITALS — BP 100/56 | HR 88 | Temp 98.6°F | Ht 64.0 in | Wt 144.5 lb

## 2011-06-11 DIAGNOSIS — R609 Edema, unspecified: Secondary | ICD-10-CM

## 2011-06-11 NOTE — Progress Notes (Signed)
  Patient Name: Anita White Date of Birth: 17-Nov-1965 Age: 46 y.o. Medical Record Number: [id number] Gender: female Date of Encounter: 06/11/2011  History of Present Illness:  Anita White is a 46 y.o. very pleasant female patient who presents with the following:  ER f/u:  Peripheral edema, f/u:  The patient has some left greater than right lower extremity and left upper extremity edema yesterday, and she ultimately went to the emergency room. The patient had ultrasounds done which were negative.  At this point she is asymptomatic and feeling fine. She did have a small varicose vein on her lower extremity that she has noticed.  Past Medical History, Surgical History, Social History, Family History, Problem List, Medications, and Allergies have been reviewed and updated if relevant.  Review of Systems:  GEN: No acute illnesses, no fevers, chills. GI: No n/v/d, eating normally Pulm: No SOB Interactive and getting along well at home.  Otherwise, ROS is as per the HPI.   Physical Examination: Filed Vitals:   06/11/11 0854  BP: 100/56  Pulse: 88  Temp: 98.6 F (37 C)  TempSrc: Oral  Height: 5\' 4"  (1.626 m)  Weight: 144 lb 8 oz (65.545 kg)    Body mass index is 24.80 kg/(m^2).   GEN: WDWN, NAD, Non-toxic, Alert & Oriented x 3 HEENT: Atraumatic, Normocephalic.  Ears and Nose: No external deformity. EXTR: No clubbing/cyanosis/edema NEURO: Normal gait.  PSYCH: Normally interactive. Conversant. Not depressed or anxious appearing.  Calm demeanor.    Assessment and Plan: 1. Edema     Reassured her. I think this is benign. Could have been from some fluid dilation from her being on her feet, or due to some heat, or changes in the venous system due to age.

## 2011-06-13 NOTE — ED Provider Notes (Signed)
Medical screening examination/treatment/procedure(s) were conducted as a shared visit with non-physician practitioner(s) and myself.  I personally evaluated the patient during the encounter  See my initial note for H and P  Cyndra Numbers, MD 06/13/11 [phone]

## 2011-11-05 ENCOUNTER — Other Ambulatory Visit: Payer: Self-pay | Admitting: Gynecology

## 2011-11-05 DIAGNOSIS — Z1231 Encounter for screening mammogram for malignant neoplasm of breast: Secondary | ICD-10-CM

## 2011-12-09 ENCOUNTER — Ambulatory Visit
Admission: RE | Admit: 2011-12-09 | Discharge: 2011-12-09 | Disposition: A | Discharge status: Home / Self Care | Payer: 59 | Source: Ambulatory Visit | Attending: Gynecology | Admitting: Gynecology

## 2011-12-09 DIAGNOSIS — Z1231 Encounter for screening mammogram for malignant neoplasm of breast: Secondary | ICD-10-CM

## 2011-12-28 ENCOUNTER — Other Ambulatory Visit: Payer: Self-pay | Admitting: Gynecology

## 2012-02-09 ENCOUNTER — Encounter: Payer: Self-pay | Admitting: Family Medicine

## 2012-02-09 ENCOUNTER — Ambulatory Visit (INDEPENDENT_AMBULATORY_CARE_PROVIDER_SITE_OTHER): Payer: 59 | Admitting: Family Medicine

## 2012-02-09 VITALS — BP 100/64 | HR 80 | Temp 98.1°F | Ht 64.0 in | Wt 149.5 lb

## 2012-02-09 DIAGNOSIS — M79609 Pain in unspecified limb: Secondary | ICD-10-CM

## 2012-02-09 DIAGNOSIS — M79672 Pain in left foot: Secondary | ICD-10-CM

## 2012-02-09 MED ORDER — DICLOFENAC SODIUM 75 MG PO TBEC: 75.0000 mg | DELAYED_RELEASE_TABLET | Freq: Two times a day (BID) | ORAL | Status: DC

## 2012-02-09 NOTE — Assessment & Plan Note (Signed)
Doubt gout but check uric acid level.  No clear suggestion of fracture.  Likely tendonitis or foot sprain. Treat with NSAID, brace, stretching.

## 2012-02-09 NOTE — Patient Instructions (Signed)
Stop at lab for ureic acid level check. Start diclofenac twice daily for pain and inflammation.  Apply ice 15 min twice daily, elevate foot when not moving.  Get ACE bandage to wrap foot for support if needed.  Call if not improving in 2 weeks.

## 2012-02-09 NOTE — Progress Notes (Signed)
  Subjective:    Patient ID: Anita White, female    DOB: 04-01-1965, 46 y.o.   MRN: [id number]  HPI  46 year old female presents with few weeks of intermitant pain in left great toe. In last week she has started having severe pain when moving left great toe.  Pai radiates across back of foot,  Foot is tender when squeezing laterally. Most pain with lifting up. No hypersensitivity. Minimal redness and swelling. No fall, no injury. No change in activity. Walks some and at her job she walks some but not a lot.   No personal or family history of out.  Advil helps some.  Review of Systems  Constitutional: Negative for fever and fatigue.  HENT: Negative for ear pain.   Eyes: Negative for pain.  Respiratory: Negative for chest tightness and shortness of breath.   Cardiovascular: Negative for chest pain, palpitations and leg swelling.  Gastrointestinal: Negative for abdominal pain.  Genitourinary: Negative for dysuria.       Objective:   Physical Exam  Constitutional: She appears well-developed and well-nourished.  Neck: Normal range of motion. Neck supple.  Cardiovascular: Normal rate.   Pulmonary/Chest: Effort normal and breath sounds normal.  Musculoskeletal:       Right ankle: Normal.       Left ankle: Normal. Achilles tendon normal.       Right foot: Normal.       Left foot: She exhibits tenderness. She exhibits normal range of motion, no bony tenderness, no swelling and normal capillary refill.       ttp over dorsal foot, no redness and swelling in great toe.          Assessment & Plan:

## 2012-07-12 ENCOUNTER — Encounter: Payer: Self-pay | Admitting: Family Medicine

## 2012-07-12 ENCOUNTER — Ambulatory Visit (INDEPENDENT_AMBULATORY_CARE_PROVIDER_SITE_OTHER): Payer: 59 | Admitting: Family Medicine

## 2012-07-12 VITALS — BP 120/70 | HR 75 | Temp 98.5°F | Ht 64.0 in | Wt 153.0 lb

## 2012-07-12 DIAGNOSIS — H6980 Other specified disorders of Eustachian tube, unspecified ear: Secondary | ICD-10-CM

## 2012-07-12 DIAGNOSIS — H6981 Other specified disorders of Eustachian tube, right ear: Secondary | ICD-10-CM

## 2012-07-12 MED ORDER — FLUTICASONE PROPIONATE 50 MCG/ACT NA SUSP: 2.0000 | Freq: Every day | NASAL | Status: DC

## 2012-07-12 NOTE — Progress Notes (Signed)
  Subjective:    Patient ID: Anita White, female    DOB: 12/22/1965, 47 y.o.   MRN: [id number]  HPI 47 year old female presents with 2-3 weeks of  mild dull headache off and on. Preceeded by 10 min of tingling on left head.  Pain is throbbing in right temple. Mild nausea momentary. No photophonophbia. Right ear pain in last 2-3 weeks. Hearing cracking when she swallows.  Minimal congestion. Had been using claritin for congestion last month but has not needed any recently.  Occ dizzy spells lasting a few seconds, feels of balance then back to normal.  No fever. No vision change. No numbness, no weakness.  Has taken tylenol off and on.Marland Kitchen Helps headache to resolve. Not swimming.   No new meds, prescription or OTC. Started estroven for hot flashes 6-8 months ago.      Review of Systems  Constitutional: Negative for fever and fatigue.  Eyes: Negative for pain.  Respiratory: Negative for chest tightness and shortness of breath.   Cardiovascular: Negative for chest pain, palpitations and leg swelling.  Gastrointestinal: Negative for abdominal pain.  Genitourinary: Negative for dysuria.       Objective:   Physical Exam  Constitutional: Vital signs are normal. She appears well-developed and well-nourished. She is cooperative.  Non-toxic appearance. She does not appear ill. No distress.  HENT:  Head: Normocephalic.  Right Ear: Hearing, external ear and ear canal normal. Tympanic membrane is not erythematous, not retracted and not bulging. A middle ear effusion is present.  Left Ear: Hearing, tympanic membrane, external ear and ear canal normal. Tympanic membrane is not erythematous, not retracted and not bulging.  No middle ear effusion.  Nose: Mucosal edema and rhinorrhea present. Right sinus exhibits no maxillary sinus tenderness and no frontal sinus tenderness. Left sinus exhibits no maxillary sinus tenderness and no frontal sinus tenderness.  Mouth/Throat: Uvula is midline,  oropharynx is clear and moist and mucous membranes are normal.  Eyes: Conjunctivae, EOM and lids are normal. Pupils are equal, round, and reactive to light. No foreign bodies found.  Neck: Trachea normal and normal range of motion. Neck supple. Carotid bruit is not present. No mass and no thyromegaly present.  Cardiovascular: Normal rate, regular rhythm, S1 normal, S2 normal, normal heart sounds, intact distal pulses and normal pulses.  Exam reveals no gallop and no friction rub.   No murmur heard. Pulmonary/Chest: Effort normal and breath sounds normal. Not tachypneic. No respiratory distress. She has no decreased breath sounds. She has no wheezes. She has no rhonchi. She has no rales.  Neurological: She is alert.  Skin: Skin is warm, dry and intact. No rash noted.  Psychiatric: Her speech is normal and behavior is normal. Judgment normal. Her mood appears not anxious. Cognition and memory are normal. She does not exhibit a depressed mood.          Assessment & Plan:

## 2012-07-12 NOTE — Patient Instructions (Addendum)
Start nasal steroid. Add oral decongestant like sudafed. Call if symptoms are not improving in 2 weeks.

## 2012-07-12 NOTE — Assessment & Plan Note (Signed)
No sign of infection. Nml neuro exam. Treat with decongestant and nasal steroid spray.

## 2013-03-14 ENCOUNTER — Other Ambulatory Visit: Payer: Self-pay | Admitting: Obstetrics and Gynecology

## 2013-03-14 DIAGNOSIS — Z1231 Encounter for screening mammogram for malignant neoplasm of breast: Secondary | ICD-10-CM

## 2013-03-20 ENCOUNTER — Ambulatory Visit
Admission: RE | Admit: 2013-03-20 | Discharge: 2013-03-20 | Disposition: A | Discharge status: Home / Self Care | Payer: 59 | Source: Ambulatory Visit | Attending: Obstetrics and Gynecology | Admitting: Obstetrics and Gynecology

## 2013-03-20 DIAGNOSIS — Z1231 Encounter for screening mammogram for malignant neoplasm of breast: Secondary | ICD-10-CM

## 2013-05-30 ENCOUNTER — Ambulatory Visit (INDEPENDENT_AMBULATORY_CARE_PROVIDER_SITE_OTHER): Payer: 59 | Admitting: Internal Medicine

## 2013-05-30 ENCOUNTER — Encounter: Payer: Self-pay | Admitting: Internal Medicine

## 2013-05-30 VITALS — BP 106/64 | HR 76 | Temp 98.1°F | Wt 136.5 lb

## 2013-05-30 DIAGNOSIS — A084 Viral intestinal infection, unspecified: Secondary | ICD-10-CM

## 2013-05-30 DIAGNOSIS — A088 Other specified intestinal infections: Secondary | ICD-10-CM

## 2013-05-30 MED ORDER — ONDANSETRON HCL 4 MG PO TABS: 4.0000 mg | ORAL_TABLET | Freq: Three times a day (TID) | ORAL | Status: DC | PRN

## 2013-05-30 NOTE — Progress Notes (Signed)
Subjective:    Patient ID: Anita White, female    DOB: 29-Aug-1965, 48 y.o.   MRN: [id number]  HPI  Pt presents to the clinic today with c/o nausea, vomiting, diarrhea, and dizziness. She reports this started 2 days ago. She has had multiple episodes of vomiting and diarrhea. He stool in watery and brown in color. She denies blood in her vomit or stool. She has not had sick contacts.  Review of Systems      Past Medical History  Diagnosis Date  . Personal history of other genital system and obstetric disorders(V13.29)   . Allergic rhinitis, cause unspecified   . Tobacco use disorder   . Acute sinusitis, unspecified     Current Outpatient Prescriptions  Medication Sig Dispense Refill  . aspirin EC 81 MG tablet Take 81 mg by mouth daily.      . cholecalciferol (VITAMIN D) 1000 UNITS tablet Take 1,000 Units by mouth daily.      . fluticasone (FLONASE) 50 MCG/ACT nasal spray Place 2 sprays into the nose daily.  16 g  2  . MAGNESIUM PO Take 1 tablet by mouth daily.      . Nutritional Supplements (ESTROVEN WEIGHT MANAGEMENT) CAPS Take 2 capsules by mouth daily.      . vitamin C (ASCORBIC ACID) 500 MG tablet Take 500 mg by mouth daily.       No current facility-administered medications for this visit.    Allergies  Allergen Reactions  . Bupropion Hcl     REACTION: Hives    History reviewed. No pertinent family history.  History   Social History  . Marital Status: Married    Spouse Name: N/A    Number of Children: 1  . Years of Education: N/A   Occupational History  . claims Rep for insurance    Social History Main Topics  . Smoking status: Former Smoker    Quit date: 12/10/2009  . Smokeless tobacco: Not on file  . Alcohol Use: Yes     Comment: occasional  . Drug Use: No  . Sexual Activity: Not on file   Other Topics Concern  . Not on file   Social History Narrative   Exercise--yes--golf 3 times weekly, walks 1 mile a day      Diet--3 meals veggies, no  fruit, +caffine some h20     Constitutional: Denies fever, malaise, fatigue, headache or abrupt weight changes.  Gastrointestinal: Pt reports nausea, vomiting, diarrhea. Denies abdominal pain, bloating, constipation, or blood in the stool.    Neurological: Denies dizziness, difficulty with memory, difficulty with speech or problems with balance and coordination.   No other specific complaints in a complete review of systems (except as listed in HPI above).  Objective:   Physical Exam   BP 106/64  Pulse 76  Temp(Src) 98.1 F (36.7 C) (Oral)  Wt 136 lb 8 oz (61.916 kg)  SpO2 98% Wt Readings from Last 3 Encounters:  05/30/13 136 lb 8 oz (61.916 kg)  07/12/12 153 lb (69.4 kg)  02/09/12 149 lb 8 oz (67.813 kg)    General: Appears her stated age, well developed, well nourished in NAD. Cardiovascular: Normal rate and rhythm. S1,S2 noted.  No murmur, rubs or gallops noted. No JVD or BLE edema. No carotid bruits noted. Pulmonary/Chest: Normal effort and positive vesicular breath sounds. No respiratory distress. No wheezes, rales or ronchi noted.  Abdomen: Soft and nontender. Normal bowel sounds, no bruits noted. No distention or masses noted. Liver,  spleen and kidneys non palpable.   BMET    Component Value Date/Time   NA 132* 12/17/2009 0500   K 3.5 12/17/2009 0500   CL 102 12/17/2009 0500   CO2 24 12/17/2009 0500   GLUCOSE 122* 12/17/2009 0500   BUN 3* 12/17/2009 0500   CREATININE 0.81 12/17/2009 0500   CALCIUM 8.4 12/17/2009 0500   GFRNONAA >60 12/17/2009 0500   GFRAA  Value: >60        The eGFR has been calculated using the MDRD equation. This calculation has not been validated in all clinical situations. eGFR's persistently <60 mL/min signify possible Chronic Kidney Disease. 12/17/2009 0500    Lipid Panel     Component Value Date/Time   CHOL  Value: 185        ATP III CLASSIFICATION:  <200     mg/dL   Desirable  200-239  mg/dL   Borderline High  >=240    mg/dL   High         12/17/2009 0500   TRIG 136 12/17/2009 0500   HDL 47 12/17/2009 0500   CHOLHDL 3.9 12/17/2009 0500   VLDL 27 12/17/2009 0500   LDLCALC  Value: 111        Total Cholesterol/HDL:CHD Risk Coronary Heart Disease Risk Table                     Men   Women  1/2 Average Risk   3.4   3.3  Average Risk       5.0   4.4  2 X Average Risk   9.6   7.1  3 X Average Risk  23.4   11.0        Use the calculated Patient Ratio above and the CHD Risk Table to determine the patient's CHD Risk.        ATP III CLASSIFICATION (LDL):  <100     mg/dL   Optimal  100-129  mg/dL   Near or Above                    Optimal  130-159  mg/dL   Borderline  160-189  mg/dL   High  >190     mg/dL   Very High* 12/17/2009 0500    CBC    Component Value Date/Time   WBC 13.3* 12/20/2009 0450   RBC 3.77* 12/20/2009 0450   HGB 11.5* 12/20/2009 0450   HCT 34.5* 12/20/2009 0450   PLT 225 12/20/2009 0450   MCV 91.5 12/20/2009 0450   MCH 30.5 12/20/2009 0450   MCHC 33.3 12/20/2009 0450   RDW 13.6 12/20/2009 0450   LYMPHSABS 3.3 12/16/2009 1820   MONOABS 1.2* 12/16/2009 1820   EOSABS 0.1 12/16/2009 1820   BASOSABS 0.1 12/16/2009 1820    Hgb A1C Lab Results  Component Value Date   HGBA1C  Value: 5.6 (NOTE)                                                                       According to the ADA Clinical Practice Recommendations for 2011, when HbA1c is used as a screening test:   >=6.5%   Diagnostic of Diabetes Mellitus           (  if abnormal result  is confirmed)  5.7-6.4%   Increased risk of developing Diabetes Mellitus  References:Diagnosis and Classification of Diabetes Mellitus,Diabetes Care,2011,34(Suppl 1):S62-S69 and Standards of Medical Care in         Diabetes - 2011,Diabetes UDJS,9702,63  (Suppl 1):S11-S61. 12/16/2009        Assessment & Plan:   Viral gastroenteritis:  You don't have to eat but you need to be drinking plenty of fluids eRx for zofran for nausea I would avoid pepto bismol- as soon as the virus leaves your GI  tract, the sooner you will start to feel better Wash hands, toilet handles, sink knobs and door knobs thoroughly to prevent spreading the virus  RTC as needed or if symptoms persist or worsen

## 2013-05-30 NOTE — Patient Instructions (Addendum)
Viral Gastroenteritis Viral gastroenteritis is also known as stomach flu. This condition affects the stomach and intestinal tract. It can cause sudden diarrhea and vomiting. The illness typically lasts 3 to 8 days. Most people develop an immune response that eventually gets rid of the virus. While this natural response develops, the virus can make you quite ill. CAUSES  Many different viruses can cause gastroenteritis, such as rotavirus or noroviruses. You can catch one of these viruses by consuming contaminated food or water. You may also catch a virus by sharing utensils or other personal items with an infected person or by touching a contaminated surface. SYMPTOMS  The most common symptoms are diarrhea and vomiting. These problems can cause a severe loss of body fluids (dehydration) and a body salt (electrolyte) imbalance. Other symptoms may include:  Fever.  Headache.  Fatigue.  Abdominal pain. DIAGNOSIS  Your caregiver can usually diagnose viral gastroenteritis based on your symptoms and a physical exam. A stool sample may also be taken to test for the presence of viruses or other infections. TREATMENT  This illness typically goes away on its own. Treatments are aimed at rehydration. The most serious cases of viral gastroenteritis involve vomiting so severely that you are not able to keep fluids down. In these cases, fluids must be given through an intravenous line (IV). HOME CARE INSTRUCTIONS   Drink enough fluids to keep your urine clear or pale yellow. Drink small amounts of fluids frequently and increase the amounts as tolerated.  Ask your caregiver for specific rehydration instructions.  Avoid:  Foods high in sugar.  Alcohol.  Carbonated drinks.  Tobacco.  Juice.  Caffeine drinks.  Extremely hot or cold fluids.  Fatty, greasy foods.  Too much intake of anything at one time.  Dairy products until 24 to 48 hours after diarrhea stops.  You may consume probiotics.  Probiotics are active cultures of beneficial bacteria. They may lessen the amount and number of diarrheal stools in adults. Probiotics can be found in yogurt with active cultures and in supplements.  Wash your hands well to avoid spreading the virus.  Only take over-the-counter or prescription medicines for pain, discomfort, or fever as directed by your caregiver. Do not give aspirin to children. Antidiarrheal medicines are not recommended.  Ask your caregiver if you should continue to take your regular prescribed and over-the-counter medicines.  Keep all follow-up appointments as directed by your caregiver. SEEK IMMEDIATE MEDICAL CARE IF:   You are unable to keep fluids down.  You do not urinate at least once every 6 to 8 hours.  You develop shortness of breath.  You notice blood in your stool or vomit. This may look like coffee grounds.  You have abdominal pain that increases or is concentrated in one small area (localized).  You have persistent vomiting or diarrhea.  You have a fever.  The patient is a child younger than 3 months, and he or she has a fever.  The patient is a child older than 3 months, and he or she has a fever and persistent symptoms.  The patient is a child older than 3 months, and he or she has a fever and symptoms suddenly get worse.  The patient is a baby, and he or she has no tears when crying. MAKE SURE YOU:   Understand these instructions.  Will watch your condition.  Will get help right away if you are not doing well or get worse. Document Released: 01/26/2005 Document Revised: 04/20/2011 Document Reviewed: 11/12/2010   ExitCare Patient Information 2014 [employer].  

## 2013-05-30 NOTE — Progress Notes (Signed)
Pre visit review using our clinic review tool, if applicable. No additional management support is needed unless otherwise documented below in the visit note. 

## 2013-08-15 ENCOUNTER — Ambulatory Visit: Payer: 59 | Admitting: Family Medicine

## 2013-09-08 ENCOUNTER — Encounter: Payer: Self-pay | Admitting: Family Medicine

## 2013-09-08 ENCOUNTER — Ambulatory Visit (INDEPENDENT_AMBULATORY_CARE_PROVIDER_SITE_OTHER): Payer: 59 | Admitting: Family Medicine

## 2013-09-08 VITALS — BP 90/60 | HR 71 | Temp 98.8°F | Ht 64.0 in | Wt 144.5 lb

## 2013-09-08 DIAGNOSIS — G8929 Other chronic pain: Secondary | ICD-10-CM

## 2013-09-08 DIAGNOSIS — R1011 Right upper quadrant pain: Secondary | ICD-10-CM

## 2013-09-08 DIAGNOSIS — R1013 Epigastric pain: Secondary | ICD-10-CM

## 2013-09-08 LAB — CBC WITH DIFFERENTIAL/PLATELET
BASOS ABS: 0.1 10*3/uL (ref 0.0–0.1)
BASOS PCT: 1 % (ref 0–1)
EOS ABS: 0.2 10*3/uL (ref 0.0–0.7)
Eosinophils Relative: 2 % (ref 0–5)
HCT: 39.5 % (ref 36.0–46.0)
Hemoglobin: 13.7 g/dL (ref 12.0–15.0)
Lymphocytes Relative: 42 % (ref 12–46)
Lymphs Abs: 4.6 10*3/uL — ABNORMAL HIGH (ref 0.7–4.0)
MCH: 30.8 pg (ref 26.0–34.0)
MCHC: 34.7 g/dL (ref 30.0–36.0)
MCV: 88.8 fL (ref 78.0–100.0)
Monocytes Absolute: 1 10*3/uL (ref 0.1–1.0)
Monocytes Relative: 9 % (ref 3–12)
NEUTROS PCT: 46 % (ref 43–77)
Neutro Abs: 5 10*3/uL (ref 1.7–7.7)
PLATELETS: 227 10*3/uL (ref 150–400)
RBC: 4.45 MIL/uL (ref 3.87–5.11)
RDW: 13.6 % (ref 11.5–15.5)
WBC: 10.9 10*3/uL — ABNORMAL HIGH (ref 4.0–10.5)

## 2013-09-08 NOTE — Progress Notes (Signed)
Pre visit review using our clinic review tool, if applicable. No additional management support is needed unless otherwise documented below in the visit note. 

## 2013-09-08 NOTE — Progress Notes (Signed)
[name] [address] Phone: [phone] Fax: [phone]  Patient ID: Anita White MRN: [id number], DOB: [birth year], 48 y.o. Date of Encounter: 09/08/2013  Primary Physician:  Eliezer Lofts, MD   Chief Complaint: Abdominal Pain   Subjective:   History of Present Illness:  Anita White is a 48 y.o. very pleasant female patient who presents with the following:  Indolent abdominal pain:  Feeling sick, and has been having some intermittent flare-ups. At least a year. S/p appy in the past and BTL. + pain with eating. Sometimes with eating lunch will get some pain. Raising arms helps. Acidic - worse. No melena or BRBPR.   Does not eat much fried, fatty food.   No blood in stool.   Prilosec, did not seem to help it. Took this intermittently.  Past Medical History, Surgical History, Social History, Family History, Problem List, Medications, and Allergies have been reviewed and updated if relevant.  Review of Systems:  GEN: No acute illnesses, no fevers, chills. GI: as above Pulm: No SOB Interactive and getting along well at home.  Otherwise, ROS is as per the HPI.  Objective:   Physical Examination: BP 90/60  Pulse 71  Temp(Src) 98.8 F (37.1 C) (Oral)  Ht _0  (1.626 m)  Wt 144 lb 8 oz (65.545 kg)  BMI 24.79 kg/m2   GEN: WDWN, NAD, Non-toxic, A & O x 3 HEENT: Atraumatic, Normocephalic. Neck supple. No masses, No LAD. Ears and Nose: No external deformity. CV: RRR, No M/G/R. No JVD. No thrill. No extra heart sounds. PULM: CTA B, no wheezes, crackles, rhonchi. No retractions. No resp. distress. No accessory muscle use. ABD: S, RUQ and epigastric abdominal pain, ND, + BS, No rebound, No HSM  EXTR: No c/c/e NEURO Normal gait.  PSYCH: Normally interactive. Conversant. Not depressed or anxious appearing.  Calm demeanor.   Laboratory and Imaging Data: Results for orders placed in visit on 09/08/13  HEPATIC FUNCTION PANEL      Result Value  Ref Range   Total Bilirubin 0.4  0.2 - 1.2 mg/dL   Bilirubin, Direct 0.1  0.0 - 0.3 mg/dL   Indirect Bilirubin 0.3  0.2 - 1.2 mg/dL   Alkaline Phosphatase 70  39 - 117 U/L   AST 16  0 - 37 U/L   ALT 13  0 - 35 U/L   Total Protein 7.1  6.0 - 8.3 g/dL   Albumin 4.7  3.5 - 5.2 g/dL  CBC WITH DIFFERENTIAL      Result Value Ref Range   WBC 10.9 (*) 4.0 - 10.5 K/uL   RBC 4.45  3.87 - 5.11 MIL/uL   Hemoglobin 13.7  12.0 - 15.0 g/dL   HCT 39.5  36.0 - 46.0 %   MCV 88.8  78.0 - 100.0 fL   MCH 30.8  26.0 - 34.0 pg   MCHC 34.7  30.0 - 36.0 g/dL   RDW 13.6  11.5 - 15.5 %   Platelets 227  150 - 400 K/uL   Neutrophils Relative % 46  43 - 77 %   Neutro Abs 5.0  1.7 - 7.7 K/uL   Lymphocytes Relative 42  12 - 46 %   Lymphs Abs 4.6 (*) 0.7 - 4.0 K/uL   Monocytes Relative 9  3 - 12 %   Monocytes Absolute 1.0  0.1 - 1.0 K/uL   Eosinophils Relative 2  0 - 5 %   Eosinophils Absolute 0.2  0.0 - 0.7  K/uL   Basophils Relative 1  0 - 1 %   Basophils Absolute 0.1  0.0 - 0.1 K/uL   Smear Review Criteria for review not met    BASIC METABOLIC PANEL      Result Value Ref Range   Sodium 140  135 - 145 mEq/L   Potassium 4.4  3.5 - 5.3 mEq/L   Chloride 105  96 - 112 mEq/L   CO2 28  19 - 32 mEq/L   Glucose, Bld 85  70 - 99 mg/dL   BUN 15  6 - 23 mg/dL   Creat 7.87  1.83 - 6.72 mg/dL   Calcium 9.8  8.4 - 55.0 mg/dL  H. PYLORI ANTIBODY, IGG      Result Value Ref Range   H Pylori IgG      LIPASE      Result Value Ref Range   Lipase 29  0 - 75 U/L     Assessment & Plan:   Abdominal pain, chronic, right upper quadrant - Plan: Hepatic function panel, CBC with Differential, Basic metabolic panel, H. pylori antibody, IgG, Lipase, CANCELED: Hepatic function panel, CANCELED: Lipase, CANCELED: H. pylori antibody, IgG, CANCELED: CBC with Differential, CANCELED: Basic metabolic panel  Abdominal pain, chronic, epigastric - Plan: Hepatic function panel, CBC with Differential, Basic metabolic panel, H. pylori  antibody, IgG, Lipase, CANCELED: Hepatic function panel, CANCELED: Lipase, CANCELED: H. pylori antibody, IgG, CANCELED: CBC with Differential, CANCELED: Basic metabolic panel  H. Pylori is pending.  Etiology unclear. Ulcer vs gallbladder most likely.  Check labs, add rx depending.  If labs unremarkable, check u/s  New Prescriptions   No medications on file   Modified Medications   Modified Medication Previous Medication   FLUTICASONE (FLONASE) 50 MCG/ACT NASAL SPRAY fluticasone (FLONASE) 50 MCG/ACT nasal spray      Place 2 sprays into the nose daily as needed.    Place 2 sprays into the nose daily.   Orders Placed This Encounter  Procedures  . Hepatic function panel  . CBC with Differential  . Basic metabolic panel  . H. pylori antibody, IgG  . Lipase   Follow-up: No Follow-up on file. Unless noted above, the patient is to follow-up if symptoms worsen. Red flags were reviewed with the patient.  Signed,  Elpidio Galea. Loyola Santino, MD, [facility]   Discontinued Medications   NUTRITIONAL SUPPLEMENTS (ESTROVEN WEIGHT MANAGEMENT) CAPS    Take 2 capsules by mouth daily.   ONDANSETRON (ZOFRAN) 4 MG TABLET    Take 1 tablet (4 mg total) by mouth every 8 (eight) hours as needed.   Current Medications at Discharge:   Medication List       This list is accurate as of: 09/08/13 11:59 PM.  Always use your most recent med list.               aspirin EC 81 MG tablet  Take 81 mg by mouth daily.     cholecalciferol 1000 UNITS tablet  Commonly known as:  VITAMIN D  Take 1,000 Units by mouth daily.     fluticasone 50 MCG/ACT nasal spray  Commonly known as:  FLONASE  Place 2 sprays into the nose daily as needed.     MAGNESIUM PO  Take 1 tablet by mouth daily.     vitamin C 500 MG tablet  Commonly known as:  ASCORBIC ACID  Take 500 mg by mouth daily.     VITAMIN E PO  Take 1 capsule by  mouth daily.

## 2013-09-09 LAB — BASIC METABOLIC PANEL
BUN: 15 mg/dL (ref 6–23)
CHLORIDE: 105 meq/L (ref 96–112)
CO2: 28 mEq/L (ref 19–32)
CREATININE: 0.88 mg/dL (ref 0.50–1.10)
Calcium: 9.8 mg/dL (ref 8.4–10.5)
GLUCOSE: 85 mg/dL (ref 70–99)
POTASSIUM: 4.4 meq/L (ref 3.5–5.3)
SODIUM: 140 meq/L (ref 135–145)

## 2013-09-09 LAB — LIPASE: Lipase: 29 U/L (ref 0–75)

## 2013-09-09 LAB — HEPATIC FUNCTION PANEL
ALT: 13 U/L (ref 0–35)
AST: 16 U/L (ref 0–37)
Albumin: 4.7 g/dL (ref 3.5–5.2)
Alkaline Phosphatase: 70 U/L (ref 39–117)
Bilirubin, Direct: 0.1 mg/dL (ref 0.0–0.3)
Indirect Bilirubin: 0.3 mg/dL (ref 0.2–1.2)
TOTAL PROTEIN: 7.1 g/dL (ref 6.0–8.3)
Total Bilirubin: 0.4 mg/dL (ref 0.2–1.2)

## 2013-09-11 LAB — H. PYLORI ANTIBODY, IGG: H Pylori IgG: 3.39 {ISR} — ABNORMAL HIGH

## 2013-09-12 MED ORDER — CLARITHROMYCIN 500 MG PO TABS: 500.0000 mg | ORAL_TABLET | Freq: Two times a day (BID) | ORAL | Status: DC

## 2013-09-12 MED ORDER — AMOXICILLIN 500 MG PO CAPS: 1000.0000 mg | ORAL_CAPSULE | Freq: Two times a day (BID) | ORAL | Status: DC

## 2013-09-12 MED ORDER — OMEPRAZOLE 20 MG PO CPDR: 20.0000 mg | DELAYED_RELEASE_CAPSULE | Freq: Two times a day (BID) | ORAL | Status: DC

## 2013-09-12 NOTE — Addendum Note (Signed)
Addended by: Carter Kitten on: 09/12/2013 11:59 AM   Modules accepted: Orders

## 2014-03-22 ENCOUNTER — Other Ambulatory Visit: Payer: Self-pay

## 2014-03-22 DIAGNOSIS — Z1231 Encounter for screening mammogram for malignant neoplasm of breast: Secondary | ICD-10-CM

## 2014-03-26 ENCOUNTER — Ambulatory Visit: Payer: Self-pay

## 2014-04-09 ENCOUNTER — Ambulatory Visit
Admission: RE | Admit: 2014-04-09 | Discharge: 2014-04-09 | Disposition: A | Discharge status: Home / Self Care | Payer: 59 | Source: Ambulatory Visit

## 2014-04-09 DIAGNOSIS — Z1231 Encounter for screening mammogram for malignant neoplasm of breast: Secondary | ICD-10-CM

## 2014-07-19 ENCOUNTER — Encounter: Payer: Self-pay | Admitting: Family Medicine

## 2014-07-19 ENCOUNTER — Ambulatory Visit (INDEPENDENT_AMBULATORY_CARE_PROVIDER_SITE_OTHER): Payer: 59 | Admitting: Family Medicine

## 2014-07-19 VITALS — BP 90/58 | HR 78 | Temp 98.0°F | Ht 64.0 in | Wt 143.2 lb

## 2014-07-19 DIAGNOSIS — M67331 Transient synovitis, right wrist: Secondary | ICD-10-CM | POA: Diagnosis not present

## 2014-07-19 NOTE — Progress Notes (Signed)
Pre visit review using our clinic review tool, if applicable. No additional management support is needed unless otherwise documented below in the visit note. 

## 2014-07-19 NOTE — Progress Notes (Signed)
Dr. Frederico Hamman T. Melvyn Hommes, MD, [facility] [place], [address] Phone: [phone] Fax: [phone]  07/19/2014  Patient: Anita White, MRN: [id number], DOB: 07-28-1965, 49 y.o.  Primary Physician:  Eliezer Lofts, MD  Chief Complaint: Hand Pain  Subjective:   Anita White is a 49 y.o. very pleasant female patient who presents with the following:  No trauma or repetitive with R hand.  Took some Tylenol yesterday.  She has been icing it for a day and user cockup wrist splint. Since then it has improved somewhat. She is having pain primarily just in the true wrist itself, a few weeks ago she did have some pain in some of her PIP joints. Side from that she has not had any pain swelling or bruising.  Uncle: RA  Ice and cock-up wrist splint.   Past Medical History, Surgical History, Social History, Family History, Problem List, Medications, and Allergies have been reviewed and updated if relevant.  Patient Active Problem List   Diagnosis Date Noted  . 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 ALLERGIC RHINITIS 03/10/2007  . DIABETES MELLITUS, GESTATIONAL, HX OF 03/10/2007    Past Medical History  Diagnosis Date  . Personal history of other genital system and obstetric disorders(V13.29)   . Allergic rhinitis, cause unspecified   . Tobacco use disorder   . Acute sinusitis, unspecified     Past Surgical History  Procedure Laterality Date  . Appendectomy  2004  . Tonsillectomy  2004    History   Social History  . Marital Status: Married    Spouse Name: N/A  . Number of Children: 1  . Years of Education: N/A   Occupational History  . claims Rep for insurance    Social History Main Topics  . Smoking status: Current Every Day Smoker    Last Attempt to Quit: 12/10/2009  . Smokeless tobacco: Never Used  . Alcohol Use: Yes     Comment: occasional  . Drug Use: No  . Sexual Activity: Not on file   Other Topics  Concern  . Not on file   Social History Narrative   Exercise--yes--golf 3 times weekly, walks 1 mile a day      Diet--3 meals veggies, no fruit, +caffine some h20    No family history on file.  Allergies  Allergen Reactions  . Bupropion Hcl     REACTION: Hives    Medication list reviewed and updated in full in [place].  GEN: No fevers, chills. Nontoxic. Primarily MSK c/o today. MSK: Detailed in the HPI GI: tolerating PO intake without difficulty Neuro: No numbness, parasthesias, or tingling associated. Otherwise the pertinent positives of the ROS are noted above.   Objective:   BP 90/58 mmHg  Pulse 78  Temp(Src) 98 F (36.7 C) (Oral)  Ht 5\' 4"  (1.626 m)  Wt 143 lb 4 oz (64.978 kg)  BMI 24.58 kg/m2   GEN: WDWN, NAD, Non-toxic, Alert & Oriented x 3 HEENT: Atraumatic, Normocephalic.  Ears and Nose: No external deformity. EXTR: No clubbing/cyanosis/edema NEURO: Normal gait.  PSYCH: Normally interactive. Conversant. Not depressed or anxious appearing.  Calm demeanor.    Right hand: Nontender throughout all bony anatomy from the net metatarsals through fingers. She does have tenderness and some swelling in the right sided true wrist joint. She does have some restriction with extension causes some pain. The remainder of the examination is unremarkable  Radiology: No results found.  Assessment  and Plan:   Transient synovitis of wrist, right  Recommended icing and cockup wrist splint for the next 1-2 weeks.  If symptoms persist an intra-articular injection of the wrist could be done without difficulty.  In multiple joints are involved long-term, recommended further rheumatological testing.  Signed,  Maud Deed. Chukwudi Ewen, MD   Patient's Medications  New Prescriptions   No medications on file  Previous Medications   ASPIRIN EC 81 MG TABLET    Take 81 mg by mouth daily.   CHOLECALCIFEROL (VITAMIN D) 1000 UNITS TABLET    Take 1,000 Units by mouth daily.     FLUTICASONE (FLONASE) 50 MCG/ACT NASAL SPRAY    Place 2 sprays into the nose daily as needed.   MAGNESIUM PO    Take 1 tablet by mouth daily.   OMEPRAZOLE (PRILOSEC) 20 MG CAPSULE    Take 1 capsule (20 mg total) by mouth 2 (two) times daily.   VITAMIN C (ASCORBIC ACID) 500 MG TABLET    Take 500 mg by mouth daily.   VITAMIN E PO    Take 1 capsule by mouth daily.  Modified Medications   No medications on file  Discontinued Medications   AMOXICILLIN (AMOXIL) 500 MG CAPSULE    Take 2 capsules (1,000 mg total) by mouth 2 (two) times daily.   CLARITHROMYCIN (BIAXIN) 500 MG TABLET    Take 1 tablet (500 mg total) by mouth 2 (two) times daily.

## 2015-04-26 ENCOUNTER — Other Ambulatory Visit: Payer: Self-pay

## 2015-04-26 DIAGNOSIS — Z1231 Encounter for screening mammogram for malignant neoplasm of breast: Secondary | ICD-10-CM

## 2015-05-27 ENCOUNTER — Ambulatory Visit
Admission: RE | Admit: 2015-05-27 | Discharge: 2015-05-27 | Disposition: A | Discharge status: Home / Self Care | Payer: 59 | Source: Ambulatory Visit

## 2015-05-27 DIAGNOSIS — Z1231 Encounter for screening mammogram for malignant neoplasm of breast: Secondary | ICD-10-CM

## 2015-06-25 ENCOUNTER — Telehealth: Payer: Self-pay

## 2015-06-25 NOTE — Telephone Encounter (Signed)
Due for CPX with labs prior.. Schedule if agreeable.  if refuses can get PCV 23 with nurse visit.

## 2015-06-25 NOTE — Telephone Encounter (Signed)
Pt left v/m wanting to know if needs to get another pneumonia shot; pt said last pneumonia shot she got was Nov 2011. Do not see recent annual exam; should pt schedule physical or just nurse visit?

## 2015-06-26 NOTE — Telephone Encounter (Signed)
Spoke with Rosann Auerbach.  She states she had a pneumovax at [facility] in 2011 (record requested).  I advised she would not need another Pneumonia vaccine until the age of 45.  She states she had her physical this year with her GYN but would like to get her labs done.  Please advise.

## 2015-06-27 NOTE — Telephone Encounter (Addendum)
Per records received from [facility] PNA vaccine on record but it looks like they just printed was we had in EPIC.   I was able to  find a Pneumovax vaccine given in hospital 12/17/2009.  Entered in Immunization Record.

## 2016-04-21 DIAGNOSIS — H578 Other specified disorders of eye and adnexa: Secondary | ICD-10-CM | POA: Diagnosis not present

## 2016-06-16 ENCOUNTER — Ambulatory Visit (INDEPENDENT_AMBULATORY_CARE_PROVIDER_SITE_OTHER): Payer: 59 | Admitting: Family Medicine

## 2016-06-16 ENCOUNTER — Encounter: Payer: Self-pay | Admitting: Family Medicine

## 2016-06-16 DIAGNOSIS — R1011 Right upper quadrant pain: Secondary | ICD-10-CM | POA: Insufficient documentation

## 2016-06-16 LAB — CBC WITH DIFFERENTIAL/PLATELET
BASOS ABS: 0.1 10*3/uL (ref 0.0–0.1)
Basophils Relative: 0.9 % (ref 0.0–3.0)
EOS PCT: 2.5 % (ref 0.0–5.0)
Eosinophils Absolute: 0.2 10*3/uL (ref 0.0–0.7)
HCT: 43.1 % (ref 36.0–46.0)
HEMOGLOBIN: 14.5 g/dL (ref 12.0–15.0)
LYMPHS ABS: 4.3 10*3/uL — AB (ref 0.7–4.0)
Lymphocytes Relative: 44.4 % (ref 12.0–46.0)
MCHC: 33.6 g/dL (ref 30.0–36.0)
MCV: 92.6 fl (ref 78.0–100.0)
Monocytes Absolute: 0.8 10*3/uL (ref 0.1–1.0)
Monocytes Relative: 8 % (ref 3.0–12.0)
Neutro Abs: 4.3 10*3/uL (ref 1.4–7.7)
Neutrophils Relative %: 44.2 % (ref 43.0–77.0)
Platelets: 225 10*3/uL (ref 150.0–400.0)
RBC: 4.66 Mil/uL (ref 3.87–5.11)
RDW: 13 % (ref 11.5–15.5)
WBC: 9.7 10*3/uL (ref 4.0–10.5)

## 2016-06-16 LAB — COMPREHENSIVE METABOLIC PANEL
ALBUMIN: 4.9 g/dL (ref 3.5–5.2)
ALK PHOS: 74 U/L (ref 39–117)
ALT: 13 U/L (ref 0–35)
AST: 16 U/L (ref 0–37)
BUN: 15 mg/dL (ref 6–23)
CO2: 30 mEq/L (ref 19–32)
CREATININE: 0.94 mg/dL (ref 0.40–1.20)
Calcium: 10 mg/dL (ref 8.4–10.5)
Chloride: 108 mEq/L (ref 96–112)
GFR: 66.88 mL/min (ref >60.00)
GLUCOSE: 110 mg/dL — AB (ref 70–99)
Potassium: 4.7 mEq/L (ref 3.5–5.1)
Sodium: 141 mEq/L (ref 135–145)
TOTAL PROTEIN: 7.5 g/dL (ref 6.0–8.3)
Total Bilirubin: 0.5 mg/dL (ref 0.2–1.2)

## 2016-06-16 LAB — H. PYLORI ANTIBODY, IGG: H Pylori IgG: POSITIVE — AB

## 2016-06-16 LAB — LIPASE: Lipase: 34 U/L (ref 11.0–59.0)

## 2016-06-16 NOTE — Assessment & Plan Note (Signed)
PUD, recurrent vs pancreatitis vs gallbladder disease.  Eval with labs and Hpylori given history .Marland Kitchen Likely will need US gallbladder.  Increase PPI for now.

## 2016-06-16 NOTE — Progress Notes (Signed)
   Subjective:    Patient ID: Anita White, female    DOB: 12-09-1965, 51 y.o.   MRN: [id number]  HPI    51 year old female presents with new onset pain in  RUQ.Marland Kitchen Radiates through po back.  Noted in last 2-3 months.  Pain in RUQ, intermittent, occurs after eating or if on loge trip sitting a long time. Associated with nausea, looser stool, no diarrhea, no blood in stool.  Feel somewhat better when she sits up straight, resolves after 15-20 min.  No NSAIDs, steroids.  She is taking prilosec 20 mg daily. Not eating fried, nothing spicy.. Both make it worse.  Dx in 2015 with  Hpylori  PUD: treated empirically triple therapy. Symptoms resolved until now.  S/P appendectomy  Review of Systems  Constitutional: Positive for fatigue. Negative for fever.  HENT: Negative for congestion.   Eyes: Negative for pain.  Respiratory: Negative for cough and shortness of breath.   Cardiovascular: Negative for chest pain, palpitations and leg swelling.  Gastrointestinal: Negative for abdominal pain.  Genitourinary: Negative for dysuria and vaginal bleeding.  Musculoskeletal: Negative for back pain.  Neurological: Negative for syncope, light-headedness and headaches.  Psychiatric/Behavioral: Negative for dysphoric mood.       Objective:   Physical Exam  Constitutional: Vital signs are normal. She appears well-developed and well-nourished. She is cooperative.  Non-toxic appearance. She does not appear ill. No distress.  HENT:  Head: Normocephalic.  Right Ear: Hearing, tympanic membrane, external ear and ear canal normal. Tympanic membrane is not erythematous, not retracted and not bulging.  Left Ear: Hearing, tympanic membrane, external ear and ear canal normal. Tympanic membrane is not erythematous, not retracted and not bulging.  Nose: No mucosal edema or rhinorrhea. Right sinus exhibits no maxillary sinus tenderness and no frontal sinus tenderness. Left sinus exhibits no maxillary sinus  tenderness and no frontal sinus tenderness.  Mouth/Throat: Uvula is midline, oropharynx is clear and moist and mucous membranes are normal.  Eyes: Conjunctivae, EOM and lids are normal. Pupils are equal, round, and reactive to light. Lids are everted and swept, no foreign bodies found.  Neck: Trachea normal and normal range of motion. Neck supple. Carotid bruit is not present. No thyroid mass and no thyromegaly present.  Cardiovascular: Normal rate, regular rhythm, S1 normal, S2 normal, normal heart sounds, intact distal pulses and normal pulses.  Exam reveals no gallop and no friction rub.   No murmur heard. Pulmonary/Chest: Effort normal and breath sounds normal. No tachypnea. No respiratory distress. She has no decreased breath sounds. She has no wheezes. She has no rhonchi. She has no rales.  Abdominal: Soft. Normal appearance and bowel sounds are normal. There is no hepatosplenomegaly. There is tenderness in the right upper quadrant. There is no rigidity, no rebound, no guarding and no CVA tenderness. No hernia.  Neurological: She is alert.  Skin: Skin is warm, dry and intact. No rash noted.  Psychiatric: Her speech is normal and behavior is normal. Judgment and thought content normal. Her mood appears not anxious. Cognition and memory are normal. She does not exhibit a depressed mood.          Assessment & Plan:

## 2016-06-16 NOTE — Patient Instructions (Signed)
Increase prilosec to 40 mg daily.  Please stop at the lab to set up to have labs drawn.  Avoid greasy and spicy foods.  Go to ER if severe pain, vomiting and fever.

## 2016-06-16 NOTE — Addendum Note (Signed)
Addended by: Ellamae Sia on: 06/16/2016 03:47 PM   Modules accepted: Orders

## 2016-06-16 NOTE — Progress Notes (Signed)
Pre visit review using our clinic review tool, if applicable. No additional management support is needed unless otherwise documented below in the visit note. 

## 2016-06-17 DIAGNOSIS — R1011 Right upper quadrant pain: Secondary | ICD-10-CM | POA: Diagnosis not present

## 2016-06-17 NOTE — Addendum Note (Signed)
Addended by: Daralene Milch C on: 06/17/2016 01:23 PM   Modules accepted: Orders

## 2016-06-22 LAB — HELICOBACTER PYLORI  ANTIBODY, IGM

## 2016-06-23 ENCOUNTER — Telehealth: Payer: Self-pay | Admitting: Family Medicine

## 2016-06-23 DIAGNOSIS — R1011 Right upper quadrant pain: Secondary | ICD-10-CM

## 2016-06-23 NOTE — Telephone Encounter (Signed)
-----   Message from Carter Kitten, [place] sent at 06/22/2016  2:55 PM EDT ----- Lab results discussed with Rosann Auerbach over the phone.  She wants to know what is causing the pain.  Still Symptomatic. She states Dr. Diona Browner mentioned something about doing an ultrasound depending on what the lab results showed.    Please advise.

## 2016-06-23 NOTE — Telephone Encounter (Signed)
Yes given continued pain.. Will refer for [place] of RUQ for gallbladder disease.

## 2016-06-23 NOTE — Telephone Encounter (Signed)
Left message for Joceline that Dr. Diona Browner has ordered an ultrasound and to expect a call from Rosaria Ferries or Shirlean Mylar with that appointment.

## 2016-06-24 NOTE — Telephone Encounter (Signed)
Appt made at [facility] for 06/26/16 at 8:30 and patient aware.

## 2016-06-26 ENCOUNTER — Telehealth: Payer: Self-pay | Admitting: Family Medicine

## 2016-06-26 ENCOUNTER — Ambulatory Visit (HOSPITAL_BASED_OUTPATIENT_CLINIC_OR_DEPARTMENT_OTHER)
Admission: RE | Admit: 2016-06-26 | Discharge: 2016-06-26 | Disposition: A | Discharge status: Home / Self Care | Payer: 59 | Source: Ambulatory Visit | Attending: Family Medicine | Admitting: Family Medicine

## 2016-06-26 DIAGNOSIS — R1011 Right upper quadrant pain: Secondary | ICD-10-CM | POA: Diagnosis not present

## 2016-06-26 MED ORDER — PANTOPRAZOLE SODIUM 40 MG PO TBEC: 40.0000 mg | DELAYED_RELEASE_TABLET | Freq: Every day | ORAL | 3 refills | Status: DC

## 2016-06-26 NOTE — Telephone Encounter (Signed)
-----   Message from Carter Kitten, [place] sent at 06/26/2016  9:36 AM EDT ----- Rosann Auerbach notified as instructed by telephone.  She states she is about the same on the omeprazole. She would like to change to Pantoprazole.  She states she will also continue working on her diet.

## 2016-08-18 ENCOUNTER — Telehealth: Payer: Self-pay

## 2016-08-18 NOTE — Telephone Encounter (Signed)
Pt left v/m requesting chantix to stop smoking and if needs appt call pt back. Per DPR left v/m to cb for appt with Dr Diona Browner to discuss chantix, how to take the med and any possible side effects.

## 2016-09-23 DIAGNOSIS — Z780 Asymptomatic menopausal state: Secondary | ICD-10-CM | POA: Diagnosis not present

## 2016-09-23 DIAGNOSIS — Z124 Encounter for screening for malignant neoplasm of cervix: Secondary | ICD-10-CM | POA: Diagnosis not present

## 2016-09-23 DIAGNOSIS — Z1231 Encounter for screening mammogram for malignant neoplasm of breast: Secondary | ICD-10-CM | POA: Diagnosis not present

## 2016-09-23 DIAGNOSIS — Z01419 Encounter for gynecological examination (general) (routine) without abnormal findings: Secondary | ICD-10-CM | POA: Diagnosis not present

## 2016-09-23 LAB — HM PAP SMEAR: HM Pap smear: NEGATIVE

## 2016-09-23 LAB — HM MAMMOGRAPHY

## 2016-11-12 ENCOUNTER — Ambulatory Visit (INDEPENDENT_AMBULATORY_CARE_PROVIDER_SITE_OTHER): Payer: 59 | Admitting: Family Medicine

## 2016-11-12 ENCOUNTER — Encounter: Payer: Self-pay | Admitting: Family Medicine

## 2016-11-12 VITALS — BP 110/60 | HR 89 | Temp 98.5°F | Ht 63.0 in | Wt 147.0 lb

## 2016-11-12 DIAGNOSIS — Z23 Encounter for immunization: Secondary | ICD-10-CM | POA: Diagnosis not present

## 2016-11-12 DIAGNOSIS — R5383 Other fatigue: Secondary | ICD-10-CM

## 2016-11-12 DIAGNOSIS — Z0001 Encounter for general adult medical examination with abnormal findings: Secondary | ICD-10-CM

## 2016-11-12 DIAGNOSIS — F17200 Nicotine dependence, unspecified, uncomplicated: Secondary | ICD-10-CM

## 2016-11-12 DIAGNOSIS — L239 Allergic contact dermatitis, unspecified cause: Secondary | ICD-10-CM

## 2016-11-12 DIAGNOSIS — Z1211 Encounter for screening for malignant neoplasm of colon: Secondary | ICD-10-CM | POA: Diagnosis not present

## 2016-11-12 DIAGNOSIS — Z0000 Encounter for general adult medical examination without abnormal findings: Secondary | ICD-10-CM

## 2016-11-12 DIAGNOSIS — Z720 Tobacco use: Secondary | ICD-10-CM | POA: Insufficient documentation

## 2016-11-12 MED ORDER — VARENICLINE TARTRATE 0.5 MG X 11 & 1 MG X 42 PO MISC: ORAL | 0 refills | Status: DC

## 2016-11-12 MED ORDER — VARENICLINE TARTRATE 1 MG PO TABS: 1.0000 mg | ORAL_TABLET | Freq: Two times a day (BID) | ORAL | 3 refills | Status: DC

## 2016-11-12 NOTE — Assessment & Plan Note (Addendum)
Recent extensive labs normal. Snores..may have sleep anpea although oropharynx appears wide. May be due to stress and deconditioning.. Lack of exercise  Start healthy lifestyle, quit smoking, start exercise, stress reduction.. If not improving consider sleep eval.

## 2016-11-12 NOTE — Assessment & Plan Note (Signed)
The patient was counseled on the dangers of tobacco use, and was advised to quit and referred to a tobacco cessation program ( at her work).  Reviewed strategies to maximize success, including removing cigarettes and smoking materials from environment, stress management, substitution of other forms of reinforcement, support of family/friends and pharmacotherapy (chantix).

## 2016-11-12 NOTE — Progress Notes (Signed)
Subjective:    Patient ID: Anita White, female    DOB: April 11, 1965, 51 y.o.   MRN: [id number]  HPI  The patient is here for annual wellness exam and preventative care.    Sees GYN Dr. Gita Kudo.  Had pelvic and pap, mammogram.  In last few months she has been feeling tired. 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  She is has stressful job overall. Sleeps well at night. Mild snoring, no apnea spells.  Wakes up tired. No new medications.  Occ AM headache, rarely.  Denies depression.  Glucose 86 nml. Cr nml, lfts nml TSh 1.88  Hg 14.3 Chol total 193,  tri 90, HDL 69, LDL106  Using zyrtec and flonase for allergies.  no exercsie  Social History /Family History/Past Medical History reviewed in detail and updated in EMR if needed. Blood pressure 110/60, pulse 89, temperature 98.5 F (36.9 C), temperature source Oral, height 5\' 3"  (1.6 m), weight 147 lb (66.7 kg).  Review of Systems  Constitutional: Positive for fatigue. Negative for fever.  HENT: Negative for congestion.   Eyes: Negative for pain.  Respiratory: Positive for cough. Negative for shortness of breath.        With allergies in last few days  Cardiovascular: Negative for chest pain, palpitations and leg swelling.  Gastrointestinal: Negative for abdominal pain.  Genitourinary: Negative for dysuria and vaginal bleeding.  Musculoskeletal: Negative for back pain.  Neurological: Negative for syncope, light-headedness and headaches.  Psychiatric/Behavioral: Negative for dysphoric mood.       Objective:   Physical Exam  Constitutional: Vital signs are normal. She appears well-developed and well-nourished. She is cooperative.  Non-toxic appearance. She does not appear ill. No distress.  HENT:  Head: Normocephalic.  Right Ear: Hearing, tympanic membrane, external ear and ear canal normal. Tympanic membrane is not erythematous, not retracted and not bulging.  Left Ear: Hearing, tympanic membrane, external  ear and ear canal normal. Tympanic membrane is not erythematous, not retracted and not bulging.  Nose: Nose normal. Right sinus exhibits no maxillary sinus tenderness and no frontal sinus tenderness. Left sinus exhibits no maxillary sinus tenderness and no frontal sinus tenderness.  Mouth/Throat: Uvula is midline, oropharynx is clear and moist and mucous membranes are normal.  Eyes: Pupils are equal, round, and reactive to light. Conjunctivae, EOM and lids are normal. Lids are everted and swept, no foreign bodies found.  Neck: Trachea normal and normal range of motion. Neck supple. Carotid bruit is not present. No thyroid mass and no thyromegaly present.  Cardiovascular: Normal rate, regular rhythm, S1 normal, S2 normal, normal heart sounds, intact distal pulses and normal pulses.  Exam reveals no gallop and no friction rub.   No murmur heard. Pulmonary/Chest: Effort normal and breath sounds normal. No tachypnea. No respiratory distress. She has no decreased breath sounds. She has no wheezes. She has no rhonchi. She has no rales.  Abdominal: Soft. Normal appearance and bowel sounds are normal. She exhibits no distension, no fluid wave, no abdominal bruit and no mass. There is no hepatosplenomegaly. There is no tenderness. There is no rebound, no guarding and no CVA tenderness. No hernia.  Lymphadenopathy:    She has no cervical adenopathy.    She has no axillary adenopathy.  Neurological: She is alert. She has normal strength. No cranial nerve deficit or sensory deficit.  Skin: Skin is warm, dry and intact. No rash noted.  Psychiatric: Her speech is normal and behavior is normal. Judgment normal. Her  mood appears not anxious. Cognition and memory are normal. She does not exhibit a depressed mood.          Assessment & Plan:  The patient's preventative maintenance and recommended screening tests for an annual wellness exam were reviewed in full today. Brought up to date unless services  declined.  Counselled on the importance of diet, exercise, and its role in overall health and mortality. The patient's FH and SH was reviewed, including their home life, tobacco status, and drug and alcohol status.   Vaccines: Due tdap, pans flu vaccines in next few weeks Pap/DVE:  Per GYN Mammo:  per GYN Colon:  Due  Smoking Status:smoker.. Considering chantix. Spent 5 minutes discussing cessation techniques. ETOH/ drug PPI:RJJO/ACZY

## 2016-11-12 NOTE — Patient Instructions (Addendum)
Quit smoking! You can do it! Chantix prescription sent in. Get back to regular exercise, stress reduction. If fatigue is not improving call.

## 2016-11-12 NOTE — Assessment & Plan Note (Signed)
Treat with zyrtec and flonase.

## 2016-11-16 ENCOUNTER — Encounter: Payer: Self-pay | Admitting: Family Medicine

## 2016-11-19 ENCOUNTER — Encounter: Payer: Self-pay | Admitting: Internal Medicine

## 2016-11-19 ENCOUNTER — Ambulatory Visit (INDEPENDENT_AMBULATORY_CARE_PROVIDER_SITE_OTHER): Payer: 59 | Admitting: Internal Medicine

## 2016-11-19 VITALS — BP 110/80 | HR 78 | Temp 97.9°F | Resp 18 | Wt 150.0 lb

## 2016-11-19 DIAGNOSIS — J4521 Mild intermittent asthma with (acute) exacerbation: Secondary | ICD-10-CM | POA: Diagnosis not present

## 2016-11-19 DIAGNOSIS — J45909 Unspecified asthma, uncomplicated: Secondary | ICD-10-CM | POA: Insufficient documentation

## 2016-11-19 MED ORDER — PREDNISONE 20 MG PO TABS: 40.0000 mg | ORAL_TABLET | Freq: Every day | ORAL | 0 refills | Status: DC

## 2016-11-19 MED ORDER — DOXYCYCLINE HYCLATE 100 MG PO TABS: 100.0000 mg | ORAL_TABLET | Freq: Two times a day (BID) | ORAL | 0 refills | Status: DC

## 2016-11-19 MED ORDER — ALBUTEROL SULFATE HFA 108 (90 BASE) MCG/ACT IN AERS: 2.0000 | INHALATION_SPRAY | Freq: Four times a day (QID) | RESPIRATORY_TRACT | 1 refills | Status: DC | PRN

## 2016-11-19 NOTE — Assessment & Plan Note (Signed)
Will refill albuterol Duration and worsening dictate short course of antibiotics---doxy x 5 days Will try 3 days of prednisone for symptomatic relief

## 2016-11-19 NOTE — Progress Notes (Signed)
Subjective:    Patient ID: Anita White, female    DOB: 1966-01-20, 51 y.o.   MRN: [id number]  HPI Here due to ongoing respiratory infection  Almost 2 weeks---started with nasal congestion and sneezing Now bad cough in chest Some wheezing--has out of date albuterol--not really helping Chest and head hurt with cough---tight, harsh dry cough  No sore throat Some right ear pain--better since on flonase Headache off and on--worse with the cough Some SOB feeling No fever Slight cold and sweaty feeling  Zyrtec regularly Tried robitussin DM---slight help  Current Outpatient Prescriptions on File Prior to Visit  Medication Sig Dispense Refill  . aspirin EC 81 MG tablet Take 81 mg by mouth daily.    . cholecalciferol (VITAMIN D) 1000 UNITS tablet Take 1,000 Units by mouth daily.    . fluticasone (FLONASE) 50 MCG/ACT nasal spray Place 2 sprays into the nose daily as needed.    . pantoprazole (PROTONIX) 40 MG tablet Take 1 tablet (40 mg total) by mouth daily. 30 tablet 3  . vitamin C (ASCORBIC ACID) 500 MG tablet Take 500 mg by mouth daily.    Marland Kitchen VITAMIN E PO Take 1 capsule by mouth daily.    . varenicline (CHANTIX CONTINUING MONTH PAK) 1 MG tablet Take 1 tablet (1 mg total) by mouth 2 (two) times daily. (Patient not taking: Reported on 11/19/2016) 60 tablet 3  . varenicline (CHANTIX STARTING MONTH PAK) 0.5 MG X 11 & 1 MG X 42 tablet Take one 0.5 mg tab daily for 3 days,then inc. to one 0.5 mg tab BID x 4 days, then inc. to one 1 mg tab BID. (Patient not taking: Reported on 11/19/2016) 53 tablet 0   No current facility-administered medications on file prior to visit.     Allergies  Allergen Reactions  . Bupropion Hcl     REACTION: Hives    Past Medical History:  Diagnosis Date  . Acute sinusitis, unspecified   . Allergic rhinitis, cause unspecified   . Personal history of other genital system and obstetric disorders(V13.29)   . Tobacco use disorder     Past Surgical  History:  Procedure Laterality Date  . APPENDECTOMY  2004  . TONSILLECTOMY  2004    No family history on file.  Social History   Social History  . Marital status: Married    Spouse name: N/A  . Number of children: 1  . Years of education: N/A   Occupational History  . claims Rep for insurance [employer]   Social History Main Topics  . Smoking status: Current Every Day Smoker    Last attempt to quit: 12/10/2009  . Smokeless tobacco: Never Used  . Alcohol use Yes     Comment: occasional  . Drug use: No  . Sexual activity: Not on file   Other Topics Concern  . Not on file   Social History Narrative   Exercise--yes--golf 3 times weekly, walks 1 mile a day      Diet--3 meals veggies, no fruit, +caffine some h20   Review of Systems  Just got chantix---still smoking a little No rash Some nausea--mild. No vomiting Slight loose stool this AM--only once Appetite is okay     Objective:   Physical Exam  Constitutional: She appears well-nourished. No distress.  Tight, dry cough  HENT:  Mouth/Throat: Oropharynx is clear and moist. No oropharyngeal exudate.  No sinus tenderness Mild nasal congestion TMs normal  Neck: No thyromegaly present.  Pulmonary/Chest: Effort normal.  No respiratory distress. She has no wheezes. She has no rales.  Tight cough but sounds clear  Lymphadenopathy:    She has no cervical adenopathy.          Assessment & Plan:

## 2016-12-14 DIAGNOSIS — Z23 Encounter for immunization: Secondary | ICD-10-CM | POA: Diagnosis not present

## 2016-12-18 ENCOUNTER — Encounter: Payer: Self-pay | Admitting: Family Medicine

## 2017-01-06 ENCOUNTER — Other Ambulatory Visit: Payer: Self-pay | Admitting: Family Medicine

## 2017-04-27 DIAGNOSIS — J069 Acute upper respiratory infection, unspecified: Secondary | ICD-10-CM | POA: Diagnosis not present

## 2017-04-30 DIAGNOSIS — J069 Acute upper respiratory infection, unspecified: Secondary | ICD-10-CM | POA: Diagnosis not present

## 2017-04-30 DIAGNOSIS — J029 Acute pharyngitis, unspecified: Secondary | ICD-10-CM | POA: Diagnosis not present

## 2017-04-30 DIAGNOSIS — R6889 Other general symptoms and signs: Secondary | ICD-10-CM | POA: Diagnosis not present

## 2017-04-30 DIAGNOSIS — J019 Acute sinusitis, unspecified: Secondary | ICD-10-CM | POA: Diagnosis not present

## 2017-07-26 ENCOUNTER — Ambulatory Visit: Payer: 59 | Admitting: Family Medicine

## 2017-07-26 ENCOUNTER — Encounter: Payer: Self-pay | Admitting: Family Medicine

## 2017-07-26 VITALS — BP 100/64 | HR 59 | Temp 98.2°F | Ht 63.0 in | Wt 153.5 lb

## 2017-07-26 DIAGNOSIS — M654 Radial styloid tenosynovitis [de Quervain]: Secondary | ICD-10-CM

## 2017-07-26 NOTE — Patient Instructions (Signed)
DeQuairvain's Tenosynovitis  Wear Thumb Spica Splint for 3 weeks: wear all the time. (OK to take off to shower)  Take antiinflammatories scheduled right now. At least once a day, massage the area above your thumb with an ice cube for 10-20 minutes.  After 3 weeks, wean out of your splint. (Wear less and less each day)  Get a STRESS ball, practice squeezing it. Use a RUBBER BAND, practice opening it with your fingers and thumb.  

## 2017-07-26 NOTE — Progress Notes (Signed)
Dr. Frederico Hamman T. Nataliah Hatlestad, MD, [facility] [place], [address] Phone: [phone] Fax: [phone]  07/26/2017  Patient: Anita White, MRN: [id number], DOB: 08-09-1965, 52 y.o.  Primary Physician:  Jinny Sanders, MD   Chief Complaint  Patient presents with  . Wrist Pain    Right x 3 week   Subjective:   Anita White is a 52 y.o. very pleasant female patient who presents with the following:  Twisting hurts, hurts at base of thumb. Has been hurting a lot for 3 weeks.  Has been playing some golf.   DeQ: Patient complains of pain in the first dorsal compartment just proximal to the [facility] joint, but primarily in the extensor aspect of the thumb. There is some fullness in this area. She has not had any occult trauma. There is no ecchymosis. There is a mild amount of swelling. There is no significant CMC pain. No prior history of trauma or surgery in the affected hand. Ongoing for 3 weeks.  Past Medical History, Surgical History, Social History, Family History, Problem List, Medications, and Allergies have been reviewed and updated if relevant.  Patient Active Problem List   Diagnosis Date Noted  . Asthmatic bronchitis 11/19/2016  . Current smoker 11/12/2016  . Fatigue 11/12/2016  . History of pulmonary embolism 12/23/2009  . Allergic dermatitis 03/10/2007  . DIABETES MELLITUS, GESTATIONAL, HX OF 03/10/2007    Past Medical History:  Diagnosis Date  . Acute sinusitis, unspecified   . Allergic rhinitis, cause unspecified   . Personal history of other genital system and obstetric disorders(V13.29)   . Tobacco use disorder     Past Surgical History:  Procedure Laterality Date  . APPENDECTOMY  2004  . TONSILLECTOMY  2004    Social History   Socioeconomic History  . Marital status: Married    Spouse name: Not on file  . Number of children: 1  . Years of education: Not on file  . Highest education level:  Not on file  Occupational History  . 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 Financial resource strain: Not on file  . Food insecurity:    Worry: Not on file    Inability: Not on file  . Transportation needs:    Medical: Not on file    Non-medical: Not on file  Tobacco Use  . Smoking status: Former Research scientist (life sciences)  . Smokeless tobacco: Never Used  Substance and Sexual Activity  . Alcohol use: Yes    Comment: occasional  . Drug use: No  . Sexual activity: Not on file  Lifestyle  . Physical activity:    Days per week: Not on file    Minutes per session: Not on file  . Stress: Not on file  Relationships  . Social connections:    Talks on phone: Not on file    Gets together: Not on file    Attends religious service: Not on file    Active member of club or organization: Not on file    Attends meetings of clubs or organizations: Not on file    Relationship status: Not on file  . Intimate partner violence:    Fear of current or ex partner: Not on file    Emotionally abused: Not on file    Physically abused: Not on file    Forced sexual activity: Not on file  Other Topics Concern  .  Not on file  Social History Narrative   Exercise--yes--golf 3 times weekly, walks 1 mile a day      Diet--3 meals veggies, no fruit, +caffine some h20    History reviewed. No pertinent family history.  Allergies  Allergen Reactions  . Bupropion Hcl     REACTION: Hives    Medication list reviewed and updated in full in [place].  GEN: No fevers, chills. Nontoxic. Primarily MSK c/o today. MSK: Detailed in the HPI GI: tolerating PO intake without difficulty Neuro: No numbness, parasthesias, or tingling associated. Otherwise the pertinent positives of the ROS are noted above.   Objective:   BP 100/64   Pulse (!) 59   Temp 98.2 F (36.8 C) (Oral)   Ht 5\' 3"  (1.6 m)   Wt 153 lb 8 oz (69.6 kg)   BMI 27.19 kg/m    GEN: WDWN, NAD, Non-toxic,  Alert & Oriented x 3 HEENT: Atraumatic, Normocephalic.  Ears and Nose: No external deformity. EXTR: No clubbing/cyanosis/edema NEURO: Normal gait.  PSYCH: Normally interactive. Conversant. Not depressed or anxious appearing.  Calm demeanor.   Hand: R Ecchymosis or edema: neg ROM wrist/hand/digits/elbow: full  Carpals, MCP's, digits: NT Distal Ulna and Radius: NT Supination lift test: neg Ecchymosis or edema: neg Cysts/nodules: neg Finkelstein's test: pos Snuffbox tenderness: neg Scaphoid tubercle: NT Hook of Hamate: NT Resisted supination: NT Full composite fist Grip, all digits: 5/5 str No tenosynovitis Axial load test: neg Phalen's: neg Tinel's: neg Atrophy: neg  Hand sensation: intact   Radiology: No results found.  Assessment and Plan:   De Quervain's tenosynovitis, right  We reviewed the hand anatomy and reviewed the components involved.  Ice bid for 2-3 days at a minimum Place the thumb in a thumb spica splint Hand exercises in a week or so - stress ball then opening hand with rubber bands  If continues to do poorly, may need to inject the 1st dorsal sheath   Follow-up: Return in about 1 month (around 08/23/2017).  Patient Instructions  DeQuairvain's Tenosynovitis  Wear Thumb Spica Splint for 3 weeks: wear all the time. (OK to take off to shower)  Take antiinflammatories scheduled right now. At least once a day, massage the area above your thumb with an ice cube for 10-20 minutes.  After 3 weeks, wean out of your splint. (Wear less and less each day)  Get a STRESS ball, practice squeezing it. Use a RUBBER BAND, practice opening it with your fingers and thumb.     Signed,  Maud Deed. Jessi Pitstick, MD   Allergies as of 07/26/2017      Reactions   Bupropion Hcl    REACTION: Hives      Medication List        Accurate as of 07/26/17 11:59 PM. Always use your most recent med list.          albuterol 108 (90 Base) MCG/ACT inhaler Commonly known  as:  PROVENTIL HFA;VENTOLIN HFA Inhale 2 puffs into the lungs every 6 (six) hours as needed for wheezing or shortness of breath.   aspirin EC 81 MG tablet Take 81 mg by mouth daily.   azelastine 0.1 % nasal spray Commonly known as:  ASTELIN Place into the nose.   cholecalciferol 1000 units tablet Commonly known as:  VITAMIN D Take 1,000 Units by mouth daily.   fluticasone 50 MCG/ACT nasal spray Commonly known as:  FLONASE Place 2 sprays into the nose daily as needed.   pantoprazole 40 MG  tablet Commonly known as:  PROTONIX Take 1 tablet (40 mg total) by mouth daily.   vitamin C 500 MG tablet Commonly known as:  ASCORBIC ACID Take 500 mg by mouth daily.   VITAMIN E PO Take 1 capsule by mouth daily.

## 2017-07-27 ENCOUNTER — Ambulatory Visit: Payer: 59 | Admitting: Family Medicine

## 2017-08-16 ENCOUNTER — Encounter: Payer: Self-pay | Admitting: Gastroenterology

## 2017-09-06 ENCOUNTER — Ambulatory Visit: Payer: 59 | Admitting: Family Medicine

## 2017-09-09 ENCOUNTER — Encounter: Payer: Self-pay | Admitting: Family Medicine

## 2017-09-09 ENCOUNTER — Ambulatory Visit: Payer: 59 | Admitting: Family Medicine

## 2017-09-09 VITALS — BP 100/62 | HR 81 | Temp 98.9°F | Ht 63.0 in | Wt 153.2 lb

## 2017-09-09 DIAGNOSIS — M659 Synovitis and tenosynovitis, unspecified: Secondary | ICD-10-CM

## 2017-09-09 DIAGNOSIS — M654 Radial styloid tenosynovitis [de Quervain]: Secondary | ICD-10-CM

## 2017-09-09 MED ORDER — METHYLPREDNISOLONE ACETATE 40 MG/ML IJ SUSP
20.0000 mg | Freq: Once | INTRAMUSCULAR | Status: AC
  Administered 2017-09-09: 20 mg via INTRA_ARTICULAR

## 2017-09-09 NOTE — Progress Notes (Signed)
Dr. Frederico Hamman T. Xavian Hardcastle, MD, [facility] [place], [address] Phone: [phone] Fax: [phone]  09/09/2017  Patient: Anita White, MRN: [id number], DOB: [birth year], 52 y.o.  Primary Physician:  Jinny Sanders, MD   Chief Complaint  Patient presents with  . Follow-up    Right Wrist   Subjective:   Anita White is a 52 y.o. very pleasant female patient who presents with the following:  Ongoing De Q - ongoing with the R side. Some dorsally.  I initially saw the patient in June 2019, and at that point she was having some de Quervain's tenosynovitis.  We did do some conservative treatment including some NSAIDs, ice, as well as a thumb spica splint and rehab.  Her symptoms have been persistent, so she wanted to come in and talk about other additional options or possible treatments.  Now she is also having some symptoms in the dorsum of her wrist.  Past Medical History, Surgical History, Social History, Family History, Problem List, Medications, and Allergies have been reviewed and updated if relevant.  Patient Active Problem List   Diagnosis Date Noted  . Asthmatic bronchitis 11/19/2016  . Current smoker 11/12/2016  . Fatigue 11/12/2016  . History of pulmonary embolism 12/23/2009  . Allergic dermatitis 03/10/2007  . DIABETES MELLITUS, GESTATIONAL, HX OF 03/10/2007    Past Medical History:  Diagnosis Date  . Acute sinusitis, unspecified   . Allergic rhinitis, cause unspecified   . Personal history of other genital system and obstetric disorders(V13.29)   . Tobacco use disorder     Past Surgical History:  Procedure Laterality Date  . APPENDECTOMY  2004  . TONSILLECTOMY  2004    Social History   Socioeconomic History  . Marital status: Married    Spouse name: Not on file  . Number of children: 1  . Years of education: Not on file  . Highest education level: Not on file  Occupational History  .  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 Financial resource strain: Not on file  . Food insecurity:    Worry: Not on file    Inability: Not on file  . Transportation needs:    Medical: Not on file    Non-medical: Not on file  Tobacco Use  . Smoking status: Former Research scientist (life sciences)  . Smokeless tobacco: Never Used  Substance and Sexual Activity  . Alcohol use: Yes    Comment: occasional  . Drug use: No  . Sexual activity: Not on file  Lifestyle  . Physical activity:    Days per week: Not on file    Minutes per session: Not on file  . Stress: Not on file  Relationships  . Social connections:    Talks on phone: Not on file    Gets together: Not on file    Attends religious service: Not on file    Active member of club or organization: Not on file    Attends meetings of clubs or organizations: Not on file    Relationship status: Not on file  . Intimate partner violence:    Fear of current or ex partner: Not on file    Emotionally abused: Not on file    Physically abused: Not on file    Forced sexual activity: Not on file  Other Topics Concern  . Not on file  Social History Narrative   Exercise--yes--golf 3  times weekly, walks 1 mile a day      Diet--3 meals veggies, no fruit, +caffine some h20    No family history on file.  Allergies  Allergen Reactions  . Bupropion Hcl     REACTION: Hives    Medication list reviewed and updated in full in [place].  GEN: No fevers, chills. Nontoxic. Primarily MSK c/o today. MSK: Detailed in the HPI GI: tolerating PO intake without difficulty Neuro: No numbness, parasthesias, or tingling associated. Otherwise the pertinent positives of the ROS are noted above.   Objective:   BP 100/62   Pulse 81   Temp 98.9 F (37.2 C) (Oral)   Ht 5\' 3"  (1.6 m)   Wt 153 lb 4 oz (69.5 kg)   BMI 27.15 kg/m    GEN: WDWN, NAD, Non-toxic, Alert & Oriented x 3 HEENT: Atraumatic, Normocephalic.  Ears  and Nose: No external deformity. EXTR: No clubbing/cyanosis/edema NEURO: Normal gait.  PSYCH: Normally interactive. Conversant. Not depressed or anxious appearing.  Calm demeanor.   Hand: R Ecchymosis or edema: neg ROM wrist/hand/digits/elbow: full  Carpals, MCP's, digits: NT Distal Ulna and Radius: NT Supination lift test: neg Ecchymosis or edema: fullness at true wrist Cysts/nodules: neg Finkelstein's test: POS Snuffbox tenderness: neg Scaphoid tubercle: NT Hook of [place]: NT Resisted supination: NT Full composite fist Grip, all digits: 5/5 str Axial load test: some pain and pain with radial and ulnar deviation Phalen's: neg Tinel's: neg Atrophy: neg  Hand sensation: intact   Radiology: No results found.  Assessment and Plan:   De Quervain's tenosynovitis, right - Plan: methylPREDNISolone acetate (DEPO-MEDROL) injection 20 mg, methylPREDNISolone acetate (DEPO-MEDROL) injection 20 mg  Synovitis of wrist  De Quervain's tenosynovitis on the right that is not improved with more traditional conservative therapy and now with some additional pain in the wrist, most likely synovitis.  For going to try doing some corticosteroid injections of both to see if this will help calm down.  Also offered her some physical therapy, but she declined at this point.  DeQuervain's Tenosynovitis Injection, R Verbal consent was obtained. Risks (including rare risk of infection, risk of skin atrophy, risk of skin bleaching), benefits, and alternatives were discussed. Prepped with Chloraprep and Ethyl Chloride used for anesthesia. Under sterile conditions, after tendons identified patient injected 1 1/2 cm proximal to radial styloid. Aspiration yields no blood. Decreased pain post injection. No complications. Needle size: 22 gauge Injection: 1/2 cc of Lidocaine 1% and Depo-Medrol 20 mg   Injection, Intraarticular Wrist, R Patient verbally consented for procedure; risks (including infection),  benefits, and alternatives explained. Patient prepped with Chloraprep. Ethyl chloride used for anesthesia. Using sterile technique, just distal to Lister's tubercle, using 3 cc syringe with 22 gauge needle, needle inserted and aspirated, no blood. Then 1/2 cc Lidocaine 1% and Depo-medrol 20 mg injected without difficulty into wrist.  Pressue applied, minimal blood. Tolerated well, decreased pain, no complications.   Follow-up: if needed and symptoms persist  Meds ordered this encounter  Medications  . methylPREDNISolone acetate (DEPO-MEDROL) injection 20 mg  . methylPREDNISolone acetate (DEPO-MEDROL) injection 20 mg   Signed,  Yerik Zeringue T. Daron Stutz, MD   Allergies as of 09/09/2017      Reactions   Bupropion Hcl    REACTION: Hives      Medication List        Accurate as of 09/09/17 11:59 PM. Always use your most recent med list.          albuterol 108 (  90 Base) MCG/ACT inhaler Commonly known as:  PROVENTIL HFA;VENTOLIN HFA Inhale 2 puffs into the lungs every 6 (six) hours as needed for wheezing or shortness of breath.   aspirin EC 81 MG tablet Take 81 mg by mouth daily.   azelastine 0.1 % nasal spray Commonly known as:  ASTELIN Place into the nose.   cholecalciferol 1000 units tablet Commonly known as:  VITAMIN D Take 1,000 Units by mouth daily.   fluticasone 50 MCG/ACT nasal spray Commonly known as:  FLONASE Place 2 sprays into the nose daily as needed.   pantoprazole 40 MG tablet Commonly known as:  PROTONIX Take 1 tablet (40 mg total) by mouth daily.   vitamin C 500 MG tablet Commonly known as:  ASCORBIC ACID Take 500 mg by mouth daily.   VITAMIN E PO Take 1 capsule by mouth daily.

## 2017-09-10 ENCOUNTER — Encounter: Payer: Self-pay | Admitting: Family Medicine

## 2017-10-15 ENCOUNTER — Encounter: Payer: Self-pay | Admitting: Gastroenterology

## 2017-10-15 ENCOUNTER — Ambulatory Visit (AMBULATORY_SURGERY_CENTER): Payer: Self-pay | Admitting: *Deleted

## 2017-10-15 VITALS — Ht 63.0 in | Wt 154.0 lb

## 2017-10-15 DIAGNOSIS — Z1211 Encounter for screening for malignant neoplasm of colon: Secondary | ICD-10-CM

## 2017-10-15 MED ORDER — NA SULFATE-K SULFATE-MG SULF 17.5-3.13-1.6 GM/177ML PO SOLN: 1.0000 | Freq: Once | ORAL | 0 refills | Status: AC

## 2017-10-15 NOTE — Progress Notes (Signed)
No egg or soy allergy known to patient  No issues with past sedation with any surgeries  or procedures, no intubation problems  No diet pills per patient No home 02 use per patient  No blood thinners per patient  Pt denies issues with constipation  No A fib or A flutter  EMMI video sent to pt's e mail - $15 Suprep coupon to pt today in [place]

## 2017-10-27 ENCOUNTER — Encounter: Payer: Self-pay | Admitting: *Deleted

## 2017-10-29 ENCOUNTER — Ambulatory Visit (AMBULATORY_SURGERY_CENTER): Payer: 59 | Admitting: Gastroenterology

## 2017-10-29 ENCOUNTER — Encounter: Payer: Self-pay | Admitting: Gastroenterology

## 2017-10-29 VITALS — BP 103/55 | HR 62 | Temp 98.0°F | Resp 16 | Ht 63.0 in | Wt 153.0 lb

## 2017-10-29 DIAGNOSIS — Z1211 Encounter for screening for malignant neoplasm of colon: Secondary | ICD-10-CM

## 2017-10-29 DIAGNOSIS — K635 Polyp of colon: Secondary | ICD-10-CM | POA: Diagnosis not present

## 2017-10-29 DIAGNOSIS — D125 Benign neoplasm of sigmoid colon: Secondary | ICD-10-CM

## 2017-10-29 DIAGNOSIS — D123 Benign neoplasm of transverse colon: Secondary | ICD-10-CM | POA: Diagnosis not present

## 2017-10-29 MED ORDER — SODIUM CHLORIDE 0.9 % IV SOLN: 500.0000 mL | Freq: Once | INTRAVENOUS | Status: DC

## 2017-10-29 NOTE — Progress Notes (Signed)
Called to room to assist during endoscopic procedure.  Patient ID and intended procedure confirmed with present staff. Received instructions for my participation in the procedure from the performing physician.  

## 2017-10-29 NOTE — Patient Instructions (Signed)
Polyps and Hemorrhoids  Handouts:  YOU HAD AN ENDOSCOPIC PROCEDURE TODAY AT [facility]:   Refer to the procedure report that was given to you for any specific questions about what was found during the examination.  If the procedure report does not answer your questions, please call your gastroenterologist to clarify.  If you requested that your care partner not be given the details of your procedure findings, then the procedure report has been included in a sealed envelope for you to review at your convenience later.  YOU SHOULD EXPECT: Some feelings of bloating in the abdomen. Passage of more gas than usual.  Walking can help get rid of the air that was put into your GI tract during the procedure and reduce the bloating. If you had a lower endoscopy (such as a colonoscopy or flexible sigmoidoscopy) you may notice spotting of blood in your stool or on the toilet paper. If you underwent a bowel prep for your procedure, you may not have a normal bowel movement for a few days.  Please Note:  You might notice some irritation and congestion in your nose or some drainage.  This is from the oxygen used during your procedure.  There is no need for concern and it should clear up in a day or so.  SYMPTOMS TO REPORT IMMEDIATELY:   Following lower endoscopy (colonoscopy or flexible sigmoidoscopy):  Excessive amounts of blood in the stool  Significant tenderness or worsening of abdominal pains  Swelling of the abdomen that is new, acute  Fever of 100F or higher  For urgent or emergent issues, a gastroenterologist can be reached at any hour by calling [phone].   DIET:  We do recommend a small meal at first, but then you may proceed to your regular diet.  Drink plenty of fluids but you should avoid alcoholic beverages for 24 hours.  ACTIVITY:  You should plan to take it easy for the rest of today and you should NOT DRIVE or use heavy machinery until tomorrow (because of the sedation  medicines used during the test).    FOLLOW UP: Our staff will call the number listed on your records the next business day following your procedure to check on you and address any questions or concerns that you may have regarding the information given to you following your procedure. If we do not reach you, we will leave a message.  However, if you are feeling well and you are not experiencing any problems, there is no need to return our call.  We will assume that you have returned to your regular daily activities without incident.  If any biopsies were taken you will be contacted by phone or by letter within the next 1-3 weeks.  Please call us at [phone] if you have not heard about the biopsies in 3 weeks.    SIGNATURES/CONFIDENTIALITY: You and/or your care partner have signed paperwork which will be entered into your electronic medical record.  These signatures attest to the fact that that the information above on your After Visit Summary has been reviewed and is understood.  Full responsibility of the confidentiality of this discharge information lies with you and/or your care-partner.

## 2017-10-29 NOTE — Op Note (Signed)
[facility] Patient Name: Anita White Procedure Date: 10/29/2017 11:03 AM MRN: [id number] Endoscopist: Mauri Pole , MD Age: 52 Referring MD:  Date of Birth: 03/29/1965 Gender: Female Account #: [id number] Procedure:                Colonoscopy Indications:              Screening for colorectal malignant neoplasm Medicines:                Monitored Anesthesia Care Procedure:                Pre-Anesthesia Assessment:                           - Prior to the procedure, a History and Physical                            was performed, and patient medications and                            allergies were reviewed. The patient's tolerance of                            previous anesthesia was also reviewed. The risks                            and benefits of the procedure and the sedation                            options and risks were discussed with the patient.                            All questions were answered, and informed consent                            was obtained. Prior Anticoagulants: The patient has                            taken no previous anticoagulant or antiplatelet                            agents. ASA Grade Assessment: II - A patient with                            mild systemic disease. After reviewing the risks                            and benefits, the patient was deemed in                            satisfactory condition to undergo the procedure.                           After obtaining informed consent, the colonoscope  was passed under direct vision. Throughout the                            procedure, the patient's blood pressure, pulse, and                            oxygen saturations were monitored continuously. The                            Model PCF-H190DL [id number]) scope was introduced                            through the anus and advanced to the the terminal                            ileum,  with identification of the appendiceal                            orifice and IC valve. The colonoscopy was performed                            without difficulty. The patient tolerated the                            procedure well. The quality of the bowel                            preparation was excellent. The terminal ileum,                            ileocecal valve, appendiceal orifice, and rectum                            were photographed. Scope In: 11:09:45 AM Scope Out: 11:22:50 AM Scope Withdrawal Time: 0 hours 10 minutes 8 seconds  Total Procedure Duration: 0 hours 13 minutes 5 seconds  Findings:                 The perianal and digital rectal examinations were                            normal.                           A 2 mm polyp was found in the transverse colon. The                            polyp was sessile. The polyp was removed with a                            cold biopsy forceps. Resection and retrieval were                            complete.  A 4 mm polyp was found in the sigmoid colon. The                            polyp was sessile. The polyp was removed with a                            cold snare. Resection and retrieval were complete.                           A few small-mouthed diverticula were found in the                            sigmoid colon.                           Non-bleeding internal hemorrhoids were found during                            retroflexion. The hemorrhoids were medium-sized. Complications:            No immediate complications. Estimated Blood Loss:     Estimated blood loss was minimal. Impression:               - One 2 mm polyp in the transverse colon, removed                            with a cold biopsy forceps. Resected and retrieved.                           - One 4 mm polyp in the sigmoid colon, removed with                            a cold snare. Resected and retrieved.                            - Diverticulosis in the sigmoid colon.                           - Non-bleeding internal hemorrhoids. Recommendation:           - Patient has a contact number available for                            emergencies. The signs and symptoms of potential                            delayed complications were discussed with the                            patient. Return to normal activities tomorrow.                            Written discharge instructions were provided to the  patient.                           - Resume previous diet.                           - Continue present medications.                           - Await pathology results.                           - Repeat colonoscopy in 5-10 years for surveillance                            based on pathology results. Mauri Pole, MD 10/29/2017 11:26:07 AM This report has been signed electronically.

## 2017-10-29 NOTE — Progress Notes (Signed)
To PACU, VSS. Report to [email or web address] 

## 2017-11-01 ENCOUNTER — Telehealth: Payer: Self-pay

## 2017-11-01 NOTE — Telephone Encounter (Signed)
  Follow up Call-  Call back number 10/29/2017  Post procedure Call Back phone  # [phone]  Permission to leave phone message Yes  Some recent data might be hidden     Patient questions:  Do you have a fever, pain , or abdominal swelling? No. Pain Score  0 *  Have you tolerated food without any problems? Yes.    Have you been able to return to your normal activities? Yes.    Do you have any questions about your discharge instructions: Diet   No. Medications  No. Follow up visit  No.  Do you have questions or concerns about your Care? No.  Actions: * If pain score is 4 or above: No action needed, pain <4.

## 2017-11-02 ENCOUNTER — Encounter: Payer: Self-pay | Admitting: *Deleted

## 2017-11-10 ENCOUNTER — Encounter: Payer: Self-pay | Admitting: Gastroenterology

## 2017-11-22 DIAGNOSIS — Z23 Encounter for immunization: Secondary | ICD-10-CM | POA: Diagnosis not present

## 2018-01-18 DIAGNOSIS — Z6827 Body mass index (BMI) 27.0-27.9, adult: Secondary | ICD-10-CM | POA: Diagnosis not present

## 2018-01-18 DIAGNOSIS — Z01419 Encounter for gynecological examination (general) (routine) without abnormal findings: Secondary | ICD-10-CM | POA: Diagnosis not present

## 2018-01-18 DIAGNOSIS — Z780 Asymptomatic menopausal state: Secondary | ICD-10-CM | POA: Diagnosis not present

## 2018-01-18 DIAGNOSIS — Z1231 Encounter for screening mammogram for malignant neoplasm of breast: Secondary | ICD-10-CM | POA: Diagnosis not present

## 2018-03-03 ENCOUNTER — Other Ambulatory Visit: Payer: Self-pay | Admitting: *Deleted

## 2018-03-03 ENCOUNTER — Encounter: Payer: Self-pay | Admitting: Neurology

## 2018-03-03 DIAGNOSIS — M654 Radial styloid tenosynovitis [de Quervain]: Secondary | ICD-10-CM | POA: Diagnosis not present

## 2018-03-03 DIAGNOSIS — M79641 Pain in right hand: Secondary | ICD-10-CM

## 2018-03-15 ENCOUNTER — Ambulatory Visit (INDEPENDENT_AMBULATORY_CARE_PROVIDER_SITE_OTHER): Payer: 59 | Admitting: Neurology

## 2018-03-15 DIAGNOSIS — M79641 Pain in right hand: Secondary | ICD-10-CM

## 2018-03-15 NOTE — Procedures (Signed)
[facility]  [place], [address]  [place], [place] [address] Tel: [phone] Fax:  [phone] Test Date:  03/15/2018  Patient: Anita White DOB: 1965/06/13 Physician: Narda Amber, DO  Sex: Female Height: 5\' 3"  Ref Phys: Charlotte Crumb, MD  ID#: [id number] Temp: 35.0C Technician:    Patient Complaints: This is a 53 year old female referred for evaluation of bilateral hand paresthesias worse on the right.  NCV & EMG Findings: Extensive electrodiagnostic testing of the right upper extremity and additional studies of the left shows:  1. Bilateral median, ulnar, and mixed palmar sensory responses are within normal limits. 2. Bilateral median and ulnar motor responses are within normal limits. 3. There is no evidence of active or chronic motor axonal loss changes affecting any of the tested muscles.  Motor unit configuration and recruitment pattern is within normal limits.  Impression: This is a normal study of the upper extremities.  In particular, there is no evidence of carpal tunnel syndrome or cervical radiculopathy.   ___________________________ Narda Amber, DO    Nerve Conduction Studies Anti Sensory Summary Table   Site NR Peak (ms) Norm Peak (ms) P-T Amp (V) Norm P-T Amp  Left Median Anti Sensory (2nd Digit)  35C  Wrist    2.3 <3.6 39.8 >15  Right Median Anti Sensory (2nd Digit)  35C  Wrist    2.5 <3.6 37.5 >15  Left Ulnar Anti Sensory (5th Digit)  35C  Wrist    2.3 <3.1 40.7 >10  Right Ulnar Anti Sensory (5th Digit)  35C  Wrist    2.2 <3.1 41.0 >10   Motor Summary Table   Site NR Onset (ms) Norm Onset (ms) O-P Amp (mV) Norm O-P Amp Site1 Site2 Delta-0 (ms) Dist (cm) Vel (m/s) Norm Vel (m/s)  Left Median Motor (Abd Poll Brev)  35C  Wrist    2.5 <4.0 11.9 >6 Elbow Wrist 4.6 30.0 65 >50  Elbow    7.1  11.7         Right Median Motor (Abd Poll Brev)  35C  Wrist    2.3 <4.0 9.8 >6 Elbow Wrist 4.1 27.0 66 >50  Elbow    6.4  9.6           Left Ulnar Motor (Abd Dig Minimi)  35C  Wrist    1.9 <3.1 12.4 >7 B Elbow Wrist 3.5 23.0 66 >50  B Elbow    5.4  12.0  A Elbow B Elbow 1.6 10.0 63 >50  A Elbow    7.0  11.6         Right Ulnar Motor (Abd Dig Minimi)  35C  Wrist    1.7 <3.1 11.2 >7 B Elbow Wrist 3.3 22.0 67 >50  B Elbow    5.0  11.0  A Elbow B Elbow 1.5 10.0 67 >50  A Elbow    6.5  10.5          Comparison Summary Table   Site NR Peak (ms) Norm Peak (ms) P-T Amp (V) Site1 Site2 Delta-P (ms) Norm Delta (ms)  Left Median/Ulnar Palm Comparison (Wrist - 8cm)  35C  Median Palm    1.4 <2.2 65.0 Median Palm Ulnar Palm 0.1   Ulnar Palm    1.3 <2.2 20.3      Right Median/Ulnar Palm Comparison (Wrist - 8cm)  35C  Median Palm    1.5 <2.2 50.7 Median Palm Ulnar Palm 0.2   Ulnar Palm    1.3 <2.2 17.6  EMG   Side Muscle Ins Act Fibs Psw Fasc Number Recrt Dur Dur. Amp Amp. Poly Poly. Comment  Right 1stDorInt Nml Nml Nml Nml Nml Nml Nml Nml Nml Nml Nml Nml N/A  Right PronatorTeres Nml Nml Nml Nml Nml Nml Nml Nml Nml Nml Nml Nml N/A  Right Biceps Nml Nml Nml Nml Nml Nml Nml Nml Nml Nml Nml Nml N/A  Right Triceps Nml Nml Nml Nml Nml Nml Nml Nml Nml Nml Nml Nml N/A  Right Deltoid Nml Nml Nml Nml Nml Nml Nml Nml Nml Nml Nml Nml N/A  Left 1stDorInt Nml Nml Nml Nml Nml Nml Nml Nml Nml Nml Nml Nml N/A  Left PronatorTeres Nml Nml Nml Nml Nml Nml Nml Nml Nml Nml Nml Nml N/A  Left Biceps Nml Nml Nml Nml Nml Nml Nml Nml Nml Nml Nml Nml N/A  Left Triceps Nml Nml Nml Nml Nml Nml Nml Nml Nml Nml Nml Nml N/A  Left Deltoid Nml Nml Nml Nml Nml Nml Nml Nml Nml Nml Nml Nml N/A      Waveforms:

## 2018-03-24 DIAGNOSIS — M654 Radial styloid tenosynovitis [de Quervain]: Secondary | ICD-10-CM | POA: Diagnosis not present

## 2018-03-24 DIAGNOSIS — M79641 Pain in right hand: Secondary | ICD-10-CM | POA: Diagnosis not present

## 2019-03-06 ENCOUNTER — Telehealth: Payer: Self-pay | Admitting: *Deleted

## 2019-03-06 NOTE — Telephone Encounter (Signed)
Please obtain more info... do she have any boils or  active skin infections?   Where was pt infection or was he just colonized.. was it MRSA or just staph?

## 2019-03-06 NOTE — Telephone Encounter (Signed)
Patient left a voicemail stating that her son that lives with her had to go to the ER over the weekend and have his appendix removed. Patient stated that he tested positive a staph infection and wants to know if she and her husband need to be tested ?

## 2019-03-06 NOTE — Telephone Encounter (Signed)
Okay.. no further treatment of pt and husband needed , but they should wash with antibacterial soap, wash towels, any clothes able to it hot water and or bleach.   Discard any  disposable bathroom items son uses like razor, tooth brush or sanitize bathroom, shower with antibacterial cleaner.

## 2019-03-06 NOTE — Telephone Encounter (Signed)
Spoke to patient and was advised that she does not have any boils or active skin infections. Patient stated that her son had bumps inside his nose and they told him that he had staph that could possibly turn into MRSA, so therefore they treated the staph. Pharmacy [facility]

## 2019-03-06 NOTE — Telephone Encounter (Signed)
Jana Half notified as instructed by telephone.  Patient states understanding.

## 2019-03-13 ENCOUNTER — Ambulatory Visit: Payer: 59 | Attending: Internal Medicine

## 2019-03-13 ENCOUNTER — Ambulatory Visit (INDEPENDENT_AMBULATORY_CARE_PROVIDER_SITE_OTHER): Payer: 59 | Admitting: Family Medicine

## 2019-03-13 ENCOUNTER — Encounter: Payer: Self-pay | Admitting: Family Medicine

## 2019-03-13 VITALS — Temp 97.5°F | Wt 149.0 lb

## 2019-03-13 DIAGNOSIS — Z20822 Contact with and (suspected) exposure to covid-19: Secondary | ICD-10-CM

## 2019-03-13 NOTE — Patient Instructions (Signed)
Covid Testing Location options:   [place] options:  Text "COVID" to [phone] - or -  [email or web address] to schedule at one of the [employer] locations  [place] to find locations near you: [phone]   Based on your symptoms, it looks like you have a virus.   Antibiotics are not need for a viral infection but the following will help:   1. Drink plenty of fluids 2. Get lots of rest  Sinus Congestion 1) Neti Pot (Saline rinse) -- 2 times day -- if tolerated 2) Flonase (Store Brand ok) - once daily 3) Over the counter congestion medications  Cough 1) Cough drops can be helpful 2) Nyquil (or nighttime cough medication) 3) Honey is proven to be one of the best cough medications  4) Cough medicine with Dextromethorphan can also be helpful  Sore Throat 1) Honey as above, cough drops 2) Ibuprofen or Aleve can be helpful 3) Salt water Gargles  If you develop fevers (Temperature >100.4), chills, worsening symptoms or symptoms lasting longer than 10 days return to clinic.

## 2019-03-13 NOTE — Progress Notes (Signed)
I connected with Anita White on 03/13/19 at 10:20 AM EST by video and verified that I am speaking with the correct person using two identifiers.   I discussed the limitations, risks, security and privacy concerns of performing an evaluation and management service by video and the availability of in person appointments. I also discussed with the patient that there may be a patient responsible charge related to this service. The patient expressed understanding and agreed to proceed.  Patient location: Home Provider Location: [facility] Participants: Lesleigh Noe and Gwyneth Sprout   Subjective:     Anita White is a 54 y.o. female presenting for Cough (started around 03/07/19. nasal congestion, sore throat at the beginning, post nasal drip.)     Cough This is a new problem. The current episode started in the past 7 days. The problem has been gradually improving. The cough is productive of sputum. Associated symptoms include chills, nasal congestion, postnasal drip, rhinorrhea and a sore throat. Pertinent negatives include no chest pain, fever, myalgias or shortness of breath.   Sick contact: no Loss of taste or smell: no  Felt worse the first few days Having greenish mucus Does have allergy symptoms this time of year  Review of Systems  Constitutional: Positive for chills. Negative for fever.  HENT: Positive for postnasal drip, rhinorrhea, sinus pain and sore throat.   Respiratory: Positive for cough. Negative for shortness of breath.   Cardiovascular: Negative for chest pain.  Gastrointestinal: Negative for diarrhea, nausea and vomiting.  Musculoskeletal: Negative for myalgias.     Social History   Tobacco Use  Smoking Status Current Every Day Smoker  . Packs/day: 0.25  . Years: 25.00  . Pack years: 6.25  . Types: Cigarettes  . Start date: 07/29/2017  Smokeless Tobacco Never Used  Tobacco Comment   husband has health problems          Objective:   BP Readings from Last 3 Encounters:  10/29/17 (!) 103/55  09/09/17 100/62  07/26/17 100/64   Wt Readings from Last 3 Encounters:  03/13/19 149 lb (67.6 kg)  10/29/17 153 lb (69.4 kg)  10/15/17 154 lb (69.9 kg)   Temp (!) 97.5 F (36.4 C) Comment: per patient  Wt 149 lb (67.6 kg) Comment: per patient  BMI 26.39 kg/m    Physical Exam Constitutional:      Appearance: Normal appearance. She is not ill-appearing.  HENT:     Head: Normocephalic and atraumatic.     Right Ear: External ear normal.     Left Ear: External ear normal.  Eyes:     Conjunctiva/sclera: Conjunctivae normal.  Pulmonary:     Effort: Pulmonary effort is normal. No respiratory distress.  Neurological:     Mental Status: She is alert. Mental status is at baseline.  Psychiatric:        Mood and Affect: Mood normal.        Behavior: Behavior normal.        Thought Content: Thought content normal.        Judgment: Judgment normal.             Assessment & Plan:   Problem List Items Addressed This Visit    None    Visit Diagnoses    Suspected COVID-19 virus infection    -  Primary     Discussed OTC treatment for viral illness Given symptoms possible covid-19 and would recommend being tested ER precautions given Advised  staying out of work and isolating until results have come back Information for contacting [facility] for scheduled testing provided   Given day 7 of symptoms and improving no need for abx at this time. However, if neg covid and worsening sinus pressure discussed we could do a trial and to mychart or call.   Return if symptoms worsen or fail to improve.  Lesleigh Noe, MD

## 2019-03-14 LAB — NOVEL CORONAVIRUS, NAA: SARS-CoV-2, NAA: NOT DETECTED

## 2019-03-15 ENCOUNTER — Telehealth: Payer: Self-pay | Admitting: *Deleted

## 2019-03-15 DIAGNOSIS — J0140 Acute pansinusitis, unspecified: Secondary | ICD-10-CM

## 2019-03-15 MED ORDER — AMOXICILLIN-POT CLAVULANATE 875-125 MG PO TABS: 1.0000 | ORAL_TABLET | Freq: Two times a day (BID) | ORAL | 0 refills | Status: AC

## 2019-03-15 NOTE — Telephone Encounter (Signed)
Patient advised. Patient states the dryness and sore lip is better today. No history of cold sores in the past that she knows off. Offered appointment for her symptoms of dryness and cold sore but patient said its some better today and she will call us back if she decides to make an appointment to discuss

## 2019-03-15 NOTE — Telephone Encounter (Signed)
Patent left a voicemail stating that she did a virtual visit with Dr. Einar Pheasant Monday and was sent for covid testing. Patient stated that her test came back negative. Patient stated that she is not any better. Patient stated that she thought that if she was not any better some medication may be in called in for her or she may need to come in to be seen. Patient stated that also her son was diagnosed last week with staph and she was asked if she had any open wounds. Patient stated that now she does have cold sores on her lips or the skin is broken because of dryness. Patient want's to know what the next step is?

## 2019-03-15 NOTE — Telephone Encounter (Signed)
Would recommend appointment if concerned about cold sores and dryness.   For ongoing cold symptoms and sinus pressure - antibiotic sent to pharmacy.

## 2019-09-26 ENCOUNTER — Encounter (HOSPITAL_COMMUNITY): Payer: Self-pay

## 2019-09-26 ENCOUNTER — Other Ambulatory Visit: Payer: Self-pay

## 2019-09-26 ENCOUNTER — Emergency Department (HOSPITAL_COMMUNITY)
Admission: EM | Admit: 2019-09-26 | Discharge: 2019-09-27 | Disposition: A | Discharge status: Home / Self Care | Payer: 59 | Attending: Emergency Medicine | Admitting: Emergency Medicine

## 2019-09-26 ENCOUNTER — Emergency Department (HOSPITAL_COMMUNITY): Payer: 59

## 2019-09-26 DIAGNOSIS — F17210 Nicotine dependence, cigarettes, uncomplicated: Secondary | ICD-10-CM | POA: Diagnosis not present

## 2019-09-26 DIAGNOSIS — Y939 Activity, unspecified: Secondary | ICD-10-CM | POA: Diagnosis not present

## 2019-09-26 DIAGNOSIS — J45909 Unspecified asthma, uncomplicated: Secondary | ICD-10-CM | POA: Diagnosis not present

## 2019-09-26 DIAGNOSIS — R0789 Other chest pain: Secondary | ICD-10-CM

## 2019-09-26 DIAGNOSIS — Z7982 Long term (current) use of aspirin: Secondary | ICD-10-CM | POA: Diagnosis not present

## 2019-09-26 DIAGNOSIS — X58XXXA Exposure to other specified factors, initial encounter: Secondary | ICD-10-CM | POA: Diagnosis not present

## 2019-09-26 DIAGNOSIS — T148XXA Other injury of unspecified body region, initial encounter: Secondary | ICD-10-CM

## 2019-09-26 DIAGNOSIS — Y929 Unspecified place or not applicable: Secondary | ICD-10-CM | POA: Diagnosis not present

## 2019-09-26 DIAGNOSIS — S29011A Strain of muscle and tendon of front wall of thorax, initial encounter: Secondary | ICD-10-CM | POA: Diagnosis present

## 2019-09-26 DIAGNOSIS — Y999 Unspecified external cause status: Secondary | ICD-10-CM | POA: Diagnosis not present

## 2019-09-26 LAB — BASIC METABOLIC PANEL
Anion gap: 13 (ref 5–15)
BUN: 8 mg/dL (ref 6–20)
CO2: 21 mmol/L — ABNORMAL LOW (ref 22–32)
Calcium: 9.7 mg/dL (ref 8.9–10.3)
Chloride: 106 mmol/L (ref 98–111)
Creatinine, Ser: 0.74 mg/dL (ref 0.44–1.00)
GFR calc Af Amer: >60 mL/min (ref >60)
GFR calc non Af Amer: >60 mL/min (ref >60)
Glucose, Bld: 90 mg/dL (ref 70–99)
Potassium: 4.9 mmol/L (ref 3.5–5.1)
Sodium: 140 mmol/L (ref 135–145)

## 2019-09-26 LAB — CBC
HCT: 46.2 % — ABNORMAL HIGH (ref 36.0–46.0)
Hemoglobin: 14.7 g/dL (ref 12.0–15.0)
MCH: 29.8 pg (ref 26.0–34.0)
MCHC: 31.8 g/dL (ref 30.0–36.0)
MCV: 93.7 fL (ref 80.0–100.0)
Platelets: 259 10*3/uL (ref 150–400)
RBC: 4.93 MIL/uL (ref 3.87–5.11)
RDW: 12.7 % (ref 11.5–15.5)
WBC: 10.8 10*3/uL — ABNORMAL HIGH (ref 4.0–10.5)
nRBC: 0 % (ref 0.0–0.2)

## 2019-09-26 LAB — TROPONIN I (HIGH SENSITIVITY)
Troponin I (High Sensitivity): <2 ng/L (ref <18)
Troponin I (High Sensitivity): 2 ng/L (ref <18)

## 2019-09-26 NOTE — ED Triage Notes (Signed)
Patient arrived by [facility] with left sided chest pain radiating under left breast radiating to left shoulder with tingling in fingers. States that the pain is intermittent x 2 days with some nausea.smoker.

## 2019-09-27 NOTE — Discharge Instructions (Addendum)
You may use over-the-counter Motrin (Ibuprofen), Acetaminophen (Tylenol), topical muscle creams such as SalonPas, Icy Hot, Bengay, etc. Please stretch, apply heat, and have massage therapy for additional assistance. ° °

## 2019-09-27 NOTE — ED Provider Notes (Signed)
[facility] EMERGENCY DEPARTMENT Provider Note  CSN: [id number] Arrival date & time: 09/26/19 1206  Chief Complaint(s) No chief complaint on file.  HPI Anita MONTEE is a 54 y.o. female   CC: Chest pain  Onset/Duration: 2 days Timing: Sporadic lasting minutes at a time Location: At times left upper chest or left lateral chest under the breast Quality: Stabbing Severity: Moderate to severe Modifying Factors:  Improved by: Resolves on its own  Worsened by: Movement Associated Signs/Symptoms:  Pertinent (+): Mild shortness of breath with pain. Reports feeling numbness and tingling of the left hand during the last episode (now resolved), which worried her prompted her visit.  Pertinent (-): Fevers, chills, new cough, congestion, current shortness of breath, vomiting, abdominal pain Context:    HPI  Past Medical History Past Medical History:  Diagnosis Date  . Acute sinusitis, unspecified   . Allergic rhinitis, cause unspecified   . Allergy   . GERD (gastroesophageal reflux disease)   . Personal history of other genital system and obstetric disorders(V13.29)   . Pulmonary embolism ([place])    8 yrs agp- on ASA  . Tobacco use disorder    Patient Active Problem List   Diagnosis Date Noted  . Asthmatic bronchitis 11/19/2016  . Current smoker 11/12/2016  . Fatigue 11/12/2016  . History of pulmonary embolism 12/23/2009  . Allergic dermatitis 03/10/2007  . DIABETES MELLITUS, GESTATIONAL, HX OF 03/10/2007   Home Medication(s) Prior to Admission medications   Medication Sig Start Date End Date Taking? Authorizing Provider  albuterol (PROVENTIL HFA;VENTOLIN HFA) 108 (90 Base) MCG/ACT inhaler Inhale 2 puffs into the lungs every 6 (six) hours as needed for wheezing or shortness of breath. 11/19/16  Yes Viviana Simpler I, MD  aspirin EC 81 MG tablet Take 81 mg by mouth at bedtime.    Yes [provider]  cetirizine (ZYRTEC ALLERGY) 10 MG tablet Take 10  mg by mouth daily as needed (for seasonal allergies).    Yes [provider]  Cholecalciferol (VITAMIN D-3) 25 MCG (1000 UT) CAPS Take 1,000 Units by mouth daily.   Yes [provider]  fluticasone (FLONASE) 50 MCG/ACT nasal spray Place 2 sprays into both nostrils daily as needed (for seasonal allergies).  07/12/12  Yes Bedsole, Amy E, MD  Omega-3 Fatty Acids (OMEGA-3 FISH OIL PO) Take 1 capsule by mouth daily with breakfast.   Yes [provider]  omeprazole (PRILOSEC OTC) 20 MG tablet Take 20 mg by mouth daily before breakfast.   Yes [provider]  vitamin C (ASCORBIC ACID) 500 MG tablet Take 500 mg by mouth daily.   Yes [provider]  azelastine (ASTELIN) 0.1 % nasal spray Place into the nose. Patient not taking: Reported on 09/27/2019 04/30/17   [provider]  cholecalciferol (VITAMIN D) 1000 UNITS tablet Take 1,000 Units by mouth daily.    [provider]  MITIGARE 0.6 MG CAPS Take by mouth. Patient not taking: Reported on 09/27/2019 04/06/19   [provider]  pantoprazole (PROTONIX) 40 MG tablet Take 1 tablet (40 mg total) by mouth daily. 06/26/16   Bedsole, Amy E, MD  VITAMIN E PO Take 1 capsule by mouth daily. Patient not taking: Reported on 09/27/2019    [provider]  Past Surgical History Past Surgical History:  Procedure Laterality Date  . APPENDECTOMY  2004  . TONSILLECTOMY  2004  . TUBAL LIGATION     Family History Family History  Problem Relation Age of Onset  . Colon cancer Neg Hx   . Colon polyps Neg Hx   . Esophageal cancer Neg Hx   . Stomach cancer Neg Hx   . Rectal cancer Neg Hx     Social History Social History   Tobacco Use  . Smoking status: Current Every Day Smoker    Packs/day: 0.25    Years: 25.00    Pack years: 6.25    Types: Cigarettes     Start date: 07/29/2017  . Smokeless tobacco: Never Used  . Tobacco comment: husband has health problems  Substance Use Topics  . Alcohol use: Yes    Comment: occasional  . Drug use: No   Allergies Bupropion hcl and Tomato  Review of Systems Review of Systems All other systems are reviewed and are negative for acute change except as noted in the HPI  Physical Exam Vital Signs  I have reviewed the triage vital signs BP 110/62   Pulse (!) 57   Temp 98.1 F (36.7 C) (Oral)   Resp 16   Ht 5\' 3"  (1.6 m)   Wt 66.7 kg   SpO2 97%   BMI 26.04 kg/m   Physical Exam Vitals reviewed.  Constitutional:      General: She is not in acute distress.    Appearance: She is well-developed. She is not diaphoretic.  HENT:     Head: Normocephalic and atraumatic.     Nose: Nose normal.  Eyes:     General: No scleral icterus.       Right eye: No discharge.        Left eye: No discharge.     Conjunctiva/sclera: Conjunctivae normal.     Pupils: Pupils are equal, round, and reactive to light.  Cardiovascular:     Rate and Rhythm: Normal rate and regular rhythm.     Heart sounds: No murmur heard.  No friction rub. No gallop.   Pulmonary:     Effort: Pulmonary effort is normal. No respiratory distress.     Breath sounds: Normal breath sounds. No stridor. No rales.  Abdominal:     General: There is no distension.     Palpations: Abdomen is soft.     Tenderness: There is no abdominal tenderness.  Musculoskeletal:     Cervical back: Normal range of motion and neck supple.     Thoracic back: Spasms and tenderness present. No bony tenderness.       Back:  Skin:    General: Skin is warm and dry.     Findings: No erythema or rash.  Neurological:     Mental Status: She is alert and oriented to person, place, and time.     ED Results and Treatments Labs (all labs ordered are listed, but only abnormal results are displayed) Labs Reviewed  BASIC METABOLIC PANEL - Abnormal; Notable for the  following components:      Result Value   CO2 21 (*)    All other components within normal limits  CBC - Abnormal; Notable for the following components:   WBC 10.8 (*)    HCT 46.2 (*)    All other components within normal limits  TROPONIN I (HIGH SENSITIVITY)  TROPONIN I (HIGH SENSITIVITY)  EKG    Radiology DG Chest 2 View  Result Date: 09/26/2019 CLINICAL DATA:  Chest pain radiating into the left arm and dizziness for 2 days. EXAM: CHEST - 2 VIEW COMPARISON:  PA and lateral chest 12/19/2009. FINDINGS: The lungs are clear. Heart size is normal. No pneumothorax or pleural fluid. No acute or focal bony abnormality. IMPRESSION: No acute disease. Electronically Signed   By: Inge Rise M.D.   On: 09/26/2019 13:40    Pertinent labs & imaging results that were available during my care of the patient were reviewed by me and considered in my medical decision making (see chart for details).  Medications Ordered in ED Medications - No data to display                                                                                                                                  Procedures Procedures  (including critical care time)  Medical Decision Making / ED Course I have reviewed the nursing notes for this encounter and the patient's prior records (if available in EHR or on provided paperwork).   RAYMIE TRANI was evaluated in Emergency Department on 09/27/2019 for the symptoms described in the history of present illness. She was evaluated in the context of the global COVID-19 pandemic, which necessitated consideration that the patient might be at risk for infection with the SARS-CoV-2 virus that causes COVID-19. Institutional protocols and algorithms that pertain to the evaluation of patients at risk for COVID-19 are in a state of rapid change based on information  released by regulatory bodies including the CDC and federal and state organizations. These policies and algorithms were followed during the patient's care in the ED.  Presentation is most consistent with left parascapular muscle strain/spasm.  Given patient's prior history, cardiac work-up was obtained and grossly reassuring with nonischemic EKG and negative serial troponin x2.  Low suspicion for ACS.  Presentation not classic for aortic dissection or esophageal perforation.  Low suspicion for pulmonary embolism.  Chest x-ray without evidence suggestive of pneumonia, pneumothorax, pneumomediastinum.  No abnormal contour of the mediastinum to suggest dissection. No evidence of acute injuries.       Final Clinical Impression(s) / ED Diagnoses Final diagnoses:  Left-sided chest wall pain  Muscle strain    The patient appears reasonably screened and/or stabilized for discharge and I doubt any other medical condition or other [facility] requiring further screening, evaluation, or treatment in the ED at this time prior to discharge. Safe for discharge with strict return precautions.  Disposition: Discharge  Condition: Good  I have discussed the results, Dx and Tx plan with the patient/family who expressed understanding and agree(s) with the plan. Discharge instructions discussed at length. The patient/family was given strict return precautions who verbalized understanding of the instructions. No further questions at time of discharge.    ED Discharge Orders    None       Follow  Up: Jinny Sanders, MD [place] [address] [phone]  Schedule an appointment as soon as possible for a visit  As needed     This chart was dictated using voice recognition software.  Despite best efforts to proofread,  errors can occur which can change the documentation meaning.   Fatima Blank, MD 09/27/19 [phone]

## 2020-03-29 ENCOUNTER — Other Ambulatory Visit: Payer: Self-pay

## 2020-03-29 ENCOUNTER — Ambulatory Visit: Payer: BC Managed Care – PPO | Admitting: Family Medicine

## 2020-03-29 ENCOUNTER — Encounter: Payer: Self-pay | Admitting: Family Medicine

## 2020-03-29 VITALS — BP 120/80 | HR 74 | Temp 98.2°F | Ht 63.0 in | Wt 152.0 lb

## 2020-03-29 DIAGNOSIS — R1011 Right upper quadrant pain: Secondary | ICD-10-CM | POA: Diagnosis not present

## 2020-03-29 DIAGNOSIS — Z8619 Personal history of other infectious and parasitic diseases: Secondary | ICD-10-CM | POA: Diagnosis not present

## 2020-03-29 LAB — CBC WITH DIFFERENTIAL/PLATELET
Basophils Absolute: 0.1 10*3/uL (ref 0.0–0.1)
Basophils Relative: 1 % (ref 0.0–3.0)
Eosinophils Absolute: 0.2 10*3/uL (ref 0.0–0.7)
Eosinophils Relative: 2.9 % (ref 0.0–5.0)
HCT: 43.1 % (ref 36.0–46.0)
Hemoglobin: 14.6 g/dL (ref 12.0–15.0)
Lymphocytes Relative: 41.2 % (ref 12.0–46.0)
Lymphs Abs: 3.1 10*3/uL (ref 0.7–4.0)
MCHC: 33.8 g/dL (ref 30.0–36.0)
MCV: 91.3 fl (ref 78.0–100.0)
Monocytes Absolute: 0.5 10*3/uL (ref 0.1–1.0)
Monocytes Relative: 7.1 % (ref 3.0–12.0)
Neutro Abs: 3.6 10*3/uL (ref 1.4–7.7)
Neutrophils Relative %: 47.8 % (ref 43.0–77.0)
Platelets: 256 10*3/uL (ref 150.0–400.0)
RBC: 4.73 Mil/uL (ref 3.87–5.11)
RDW: 13.1 % (ref 11.5–15.5)
WBC: 7.4 10*3/uL (ref 4.0–10.5)

## 2020-03-29 LAB — COMPREHENSIVE METABOLIC PANEL
ALT: 18 U/L (ref 0–35)
AST: 18 U/L (ref 0–37)
Albumin: 4.8 g/dL (ref 3.5–5.2)
Alkaline Phosphatase: 75 U/L (ref 39–117)
BUN: 12 mg/dL (ref 6–23)
CO2: 30 mEq/L (ref 19–32)
Calcium: 10 mg/dL (ref 8.4–10.5)
Chloride: 103 mEq/L (ref 96–112)
Creatinine, Ser: 0.79 mg/dL (ref 0.40–1.20)
GFR: 84.93 mL/min (ref >60.00)
Glucose, Bld: 96 mg/dL (ref 70–99)
Potassium: 4.4 mEq/L (ref 3.5–5.1)
Sodium: 138 mEq/L (ref 135–145)
Total Bilirubin: 0.5 mg/dL (ref 0.2–1.2)
Total Protein: 7.6 g/dL (ref 6.0–8.3)

## 2020-03-29 LAB — LIPASE: Lipase: 32 U/L (ref 11.0–59.0)

## 2020-03-29 NOTE — Patient Instructions (Addendum)
Increase nexium  to 40 mg daily... if symptoms not improving and Gi referral delayed.. can try increasing to 40 mg twice daily. Dr. Woodward Ku office will call to set up the referral appointment.  Please stop at the lab to have labs drawn.

## 2020-03-29 NOTE — Assessment & Plan Note (Signed)
Given symptoms and history of Hpylori, PUD is likely  cause of symptoms.  She is not sure if taking nexium 20 mg or 40 mg... will increase to 40 mg or 40 mg BID. Eval with labs to assess for blood loss, pancretitis and liver issue.  Less likely gallstones given clear [place] 4 years ago with similar symptoms.  Given persistent symptoms despite Hpylori treatment 4 years ago... refer to GI for likely endoscopy.

## 2020-03-29 NOTE — Progress Notes (Signed)
Patient ID: Anita White, female    DOB: [birth year], 55 y.o.   MRN: [id number]  This visit was conducted in person.  Pulse 74   Temp 98.2 F (36.8 C) (Temporal)   Ht 5\' 3"  (1.6 m)   Wt 152 lb (68.9 kg)   SpO2 98%   BMI 26.93 kg/m    CC:  Chief Complaint  Patient presents with  . RUQ Pain    Radiates around to back    Subjective:   HPI: Anita White is a 55 y.o. female presenting on 03/29/2020 for RUQ Pain (Radiates around to back)  New onset pain in RUQ... she reports she noted this  In last 6 months more regularly , even more frequently in last several weeks. Pain radiates to left mid back. In AM feels good.Marland Kitchen as day progresses increase in symptoms.  No better or worse with eating.  She has some reflux of and on.. some acid in throat at night.  She has been eating very bland, switched to Nexium now in last 6 months. Did not help at all.   No blood in stool, stools are dark but not black or coffee grounds.   She feels like soreness in right ribcage, lower, associated with nausea and occ loose stool.   [place] 2018  No gallstones.    2018, told she had a PUD, Hpylori positive ... took protonix and antibiotics, improved, but always had some pain intermittently.   Past Surgical History:  Procedure Laterality Date  . APPENDECTOMY  2004  . TONSILLECTOMY  2004  . TUBAL LIGATION          Relevant past medical, surgical, family and social history reviewed and updated as indicated. Interim medical history since our last visit reviewed. Allergies and medications reviewed and updated. Outpatient Medications Prior to Visit  Medication Sig Dispense Refill  . albuterol (PROVENTIL HFA;VENTOLIN HFA) 108 (90 Base) MCG/ACT inhaler Inhale 2 puffs into the lungs every 6 (six) hours as needed for wheezing or shortness of breath. 1 Inhaler 1  . aspirin EC 81 MG tablet Take 81 mg by mouth at bedtime.     Marland Kitchen azelastine (ASTELIN) 0.1 % nasal spray Place into the nose. (Patient  not taking: Reported on 09/27/2019)    . cetirizine (ZYRTEC ALLERGY) 10 MG tablet Take 10 mg by mouth daily as needed (for seasonal allergies).     . Cholecalciferol (VITAMIN D-3) 25 MCG (1000 UT) CAPS Take 1,000 Units by mouth daily.    . fluticasone (FLONASE) 50 MCG/ACT nasal spray Place 2 sprays into both nostrils daily as needed (for seasonal allergies).     Marland Kitchen MITIGARE 0.6 MG CAPS Take by mouth. (Patient not taking: Reported on 09/27/2019)    . Omega-3 Fatty Acids (OMEGA-3 FISH OIL PO) Take 1 capsule by mouth daily with breakfast.    . vitamin C (ASCORBIC ACID) 500 MG tablet Take 500 mg by mouth daily.    Marland Kitchen VITAMIN E PO Take 1 capsule by mouth daily. (Patient not taking: Reported on 09/27/2019)    . cholecalciferol (VITAMIN D) 1000 UNITS tablet Take 1,000 Units by mouth daily. (Patient not taking: Reported on 09/27/2019)    . omeprazole (PRILOSEC OTC) 20 MG tablet Take 20 mg by mouth daily before breakfast.    . pantoprazole (PROTONIX) 40 MG tablet Take 1 tablet (40 mg total) by mouth daily. (Patient not taking: Reported on 09/27/2019) 30 tablet 3   No facility-administered medications prior to visit.  Per HPI unless specifically indicated in ROS section below Review of Systems Objective:  Pulse 74   Temp 98.2 F (36.8 C) (Temporal)   Ht 5\' 3"  (1.6 m)   Wt 152 lb (68.9 kg)   SpO2 98%   BMI 26.93 kg/m   Wt Readings from Last 3 Encounters:  03/29/20 152 lb (68.9 kg)  09/26/19 147 lb (66.7 kg)  03/13/19 149 lb (67.6 kg)      Physical Exam    Results for orders placed or performed during the hospital encounter of 63/14/97  Basic metabolic panel  Result Value Ref Range   Sodium 140 135 - 145 mmol/L   Potassium 4.9 3.5 - 5.1 mmol/L   Chloride 106 98 - 111 mmol/L   CO2 21 (L) 22 - 32 mmol/L   Glucose, Bld 90 70 - 99 mg/dL   BUN 8 6 - 20 mg/dL   Creatinine, Ser 0.74 0.44 - 1.00 mg/dL   Calcium 9.7 8.9 - 10.3 mg/dL   GFR calc non Af Amer >60 >60 mL/min   GFR calc Af Amer >60  >60 mL/min   Anion gap 13 5 - 15  CBC  Result Value Ref Range   WBC 10.8 (H) 4.0 - 10.5 K/uL   RBC 4.93 3.87 - 5.11 MIL/uL   Hemoglobin 14.7 12.0 - 15.0 g/dL   HCT 46.2 (H) 36.0 - 46.0 %   MCV 93.7 80.0 - 100.0 fL   MCH 29.8 26.0 - 34.0 pg   MCHC 31.8 30.0 - 36.0 g/dL   RDW 12.7 11.5 - 15.5 %   Platelets 259 150 - 400 K/uL   nRBC 0.0 0.0 - 0.2 %  Troponin I (High Sensitivity)  Result Value Ref Range   Troponin I (High Sensitivity) <2 <18 ng/L  Troponin I (High Sensitivity)  Result Value Ref Range   Troponin I (High Sensitivity) 2 <18 ng/L    This visit occurred during the SARS-CoV-2 public health emergency.  Safety protocols were in place, including screening questions prior to the visit, additional usage of staff PPE, and extensive cleaning of exam room while observing appropriate contact time as indicated for disinfecting solutions.   COVID 19 screen:  No recent travel or known exposure to COVID19 The patient denies respiratory symptoms of COVID 19 at this time. The importance of social distancing was discussed today.   Assessment and Plan    Problem List Items Addressed This Visit    RUQ pain - Primary     Given symptoms and history of Hpylori, PUD is likely  cause of symptoms.  She is not sure if taking nexium 20 mg or 40 mg... will increase to 40 mg or 40 mg BID. Eval with labs to assess for blood loss, pancretitis and liver issue.  Less likely gallstones given clear [place] 4 years ago with similar symptoms.  Given persistent symptoms despite Hpylori treatment 4 years ago... refer to GI for likely endoscopy.      Relevant Orders   CBC with Differential/Platelet   Comprehensive metabolic panel   Lipase   Ambulatory referral to Gastroenterology    Other Visit Diagnoses    History of Helicobacter pylori infection       Relevant Orders   Ambulatory referral to Gastroenterology       Eliezer Lofts, MD

## 2020-04-16 ENCOUNTER — Encounter: Payer: Self-pay | Admitting: Obstetrics and Gynecology

## 2020-04-16 ENCOUNTER — Other Ambulatory Visit: Payer: Self-pay

## 2020-04-16 ENCOUNTER — Ambulatory Visit (INDEPENDENT_AMBULATORY_CARE_PROVIDER_SITE_OTHER): Payer: BC Managed Care – PPO | Admitting: Obstetrics and Gynecology

## 2020-04-16 VITALS — BP 100/66 | Ht 63.0 in | Wt 154.4 lb

## 2020-04-16 DIAGNOSIS — Z01419 Encounter for gynecological examination (general) (routine) without abnormal findings: Secondary | ICD-10-CM

## 2020-04-16 DIAGNOSIS — L853 Xerosis cutis: Secondary | ICD-10-CM

## 2020-04-16 DIAGNOSIS — L304 Erythema intertrigo: Secondary | ICD-10-CM

## 2020-04-16 DIAGNOSIS — Z1231 Encounter for screening mammogram for malignant neoplasm of breast: Secondary | ICD-10-CM

## 2020-04-16 DIAGNOSIS — Z124 Encounter for screening for malignant neoplasm of cervix: Secondary | ICD-10-CM

## 2020-04-16 DIAGNOSIS — Z0000 Encounter for general adult medical examination without abnormal findings: Secondary | ICD-10-CM

## 2020-04-16 DIAGNOSIS — R5383 Other fatigue: Secondary | ICD-10-CM

## 2020-04-16 DIAGNOSIS — Z7185 Encounter for immunization safety counseling: Secondary | ICD-10-CM

## 2020-04-16 DIAGNOSIS — Z1239 Encounter for other screening for malignant neoplasm of breast: Secondary | ICD-10-CM

## 2020-04-16 DIAGNOSIS — Z780 Asymptomatic menopausal state: Secondary | ICD-10-CM | POA: Diagnosis not present

## 2020-04-16 MED ORDER — CLOTRIMAZOLE-BETAMETHASONE 1-0.05 % EX CREA: 1.0000 "application " | TOPICAL_CREAM | Freq: Two times a day (BID) | CUTANEOUS | 0 refills | Status: AC

## 2020-04-16 NOTE — Progress Notes (Signed)
Gynecology Annual Exam  PCP: Jinny Sanders, MD  Chief Complaint:  Chief Complaint  Patient presents with  . Gynecologic Exam    Annual - no concerns. RM  5    History of Present Illness:Patient is a 55 y.o. G1P1001 presents for annual exam. The patient states she has no concerns today. She does note itching and irritation intermittent under and between her breasts. Additionally, she notes persistent itching in her hands, primarily her left hand. Patient reports she is a frequent handwasher and uses hand lotions regularly. She states she has tried change the hand creams/lotions she is using and that she recently switched to using Vaseline only, but still notes persistent itching in hands. Patient states she has not noted any skin changes to her hands.  LMP: No LMP recorded. Patient is postmenopausal. Intermenstrual Bleeding: no Postcoital Bleeding: no Dysmenorrhea: not applicable  The patient is not currently sexually active. She denies dyspareunia. There is no notable family history of breast or ovarian cancer in her family.  The patient wears seatbelts: no.   The patient has regular exercise: yes.  She reports walking 3 times weekly, attempts to walk a mile each time.  The patient denies current symptoms of depression.     PQH-9: 3 [no to question #9] GAD-7: 2   Review of Systems: Review of Systems  Constitutional: Positive for malaise/fatigue. Negative for fever and weight loss.  Eyes: Negative.   Respiratory: Negative.   Cardiovascular: Negative.   Gastrointestinal: Negative.   Genitourinary: Negative.   Musculoskeletal: Negative.   Skin: Positive for itching.  Neurological: Negative.   Endo/Heme/Allergies: Positive for environmental allergies.  Psychiatric/Behavioral: Negative.     Past Medical History:  Patient Active Problem List   Diagnosis Date Noted  . Asthmatic bronchitis 11/19/2016  . Current smoker 11/12/2016  . Fatigue 11/12/2016  . RUQ pain  06/16/2016  . History of pulmonary embolism 12/23/2009    Qualifier: Diagnosis of  By: Diona Browner MD, Amy     . Allergic dermatitis 03/10/2007    Qualifier: Diagnosis of  By: Fuller Plan CMA (AAMA), Lugene     . DIABETES MELLITUS, GESTATIONAL, HX OF 03/10/2007    Qualifier: Diagnosis of  By: Fuller Plan CMA (AAMA), Terri Skains       Past Surgical History:  Past Surgical History:  Procedure Laterality Date  . APPENDECTOMY  2004  . TONSILLECTOMY  2004  . TUBAL LIGATION      Gynecologic History:  No LMP recorded. Patient is postmenopausal. Last Pap: 2018 Results were:  NILM  Last mammogram: 2021 Results were: BI-RAD I  Obstetric History: G1P1001  Family History:  Family History  Problem Relation Age of Onset  . Colon cancer Neg Hx   . Colon polyps Neg Hx   . Esophageal cancer Neg Hx   . Stomach cancer Neg Hx   . Rectal cancer Neg Hx     Social History:  Social History   Socioeconomic History  . Marital status: Married    Spouse name: Not on file  . Number of children: 1  . Years of education: Not on file  . Highest education level: Not on file  Occupational History  . 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 Smoking status: Current Every Day Smoker    Packs/day: 0.25    Years: 25.00    Pack years: 6.25    Types: Cigarettes    Start date: 07/29/2017  . Smokeless  tobacco: Never Used  . Tobacco comment: husband has health problems  Vaping Use  . Vaping Use: Never used  Substance and Sexual Activity  . Alcohol use: Yes    Comment: occasional  . Drug use: No  . Sexual activity: Not Currently    Birth control/protection: Post-menopausal  Other Topics Concern  . Not on file  Social History Narrative   Exercise--yes--golf 3 times weekly, walks 1 mile a day      Diet--3 meals veggies, no fruit, +caffine some h20   Social Determinants of Health   Financial Resource Strain: Not on file  Food Insecurity: Not on file   Transportation Needs: Not on file  Physical Activity: Not on file  Stress: Not on file  Social Connections: Not on file  Intimate Partner Violence: Not on file    Allergies:  Allergies  Allergen Reactions  . Bupropion Hcl Hives  . Tomato Other (See Comments)    Heartburn     Medications: Prior to Admission medications   Medication Sig Start Date End Date Taking? Authorizing Provider  aspirin EC 81 MG tablet Take 81 mg by mouth at bedtime.    Yes [provider]  Cholecalciferol (VITAMIN D-3) 25 MCG (1000 UT) CAPS Take 1,000 Units by mouth daily.   Yes [provider]  clotrimazole-betamethasone (LOTRISONE) cream Apply 1 application topically 2 (two) times daily. 04/16/20  Yes Veal, Katelyn, CNM  esomeprazole (NEXIUM) 40 MG capsule Take 40 mg by mouth daily.   Yes [provider]  Omega-3 Fatty Acids (OMEGA-3 FISH OIL PO) Take 1 capsule by mouth daily with breakfast.   Yes [provider]  vitamin C (ASCORBIC ACID) 500 MG tablet Take 500 mg by mouth daily.   Yes [provider]  albuterol (PROVENTIL HFA;VENTOLIN HFA) 108 (90 Base) MCG/ACT inhaler Inhale 2 puffs into the lungs every 6 (six) hours as needed for wheezing or shortness of breath. 11/19/16   Venia Carbon, MD  azelastine (ASTELIN) 0.1 % nasal spray Place into the nose. 04/30/17   [provider]  cetirizine (ZYRTEC) 10 MG tablet Take 10 mg by mouth daily as needed (for seasonal allergies).     [provider]  fluticasone (FLONASE) 50 MCG/ACT nasal spray Place 2 sprays into both nostrils daily as needed (for seasonal allergies).  07/12/12   Jinny Sanders, MD  MITIGARE 0.6 MG CAPS Take by mouth. 04/06/19   [provider]  VITAMIN E PO Take 1 capsule by mouth daily.    [provider]    Physical Exam Vitals: Blood pressure 100/66, height 5\' 3"  (1.6 m), weight 154 lb 6 oz (70 kg).  General: NAD HEENT: normocephalic, anicteric Thyroid: no  enlargement, no palpable nodules Pulmonary: No increased work of breathing, CTAB Cardiovascular: RRR, distal pulses 2+ Breast: Breast symmetrical, no tenderness, no palpable nodules or masses, no skin or nipple retraction present, no nipple discharge.  No axillary or supraclavicular lymphadenopathy. Pink, moist skin noted under the breast and slightly between the lower portions of both breasts.  Abdomen: NABS, soft, non-tender, non-distended.  Umbilicus without lesions.  No hepatomegaly, splenomegaly or masses palpable. No evidence of hernia  Genitourinary:  External: Normal external female genitalia.  Normal urethral meatus, normal Bartholin's and Skene's glands.    Vagina: Normal vaginal mucosa, no evidence of prolapse.    Cervix: Grossly normal in appearance, no bleeding  Bimanual: deferred   Rectal: deferred  Lymphatic: no evidence of inguinal lymphadenopathy Extremities: no edema, erythema,  or tenderness Neurologic: Grossly intact Psychiatric: mood appropriate, affect full   Assessment: 55 y.o. G1P1001 routine annual exam. Patient with recent increased symptoms of fatigue and skin changes.   Plan: Problem List Items Addressed This Visit      Other   Fatigue   Relevant Orders   TSH    Other Visit Diagnoses    Screening mammogram for breast cancer    -  Primary   Relevant Orders   MM DIGITAL SCREENING BILATERAL   Screening for cervical cancer       Relevant Orders   Cytology - PAP   Immunization counseling       Routine health maintenance       Encounter for gynecological examination without abnormal finding       Screening breast examination       Dry skin dermatitis       Relevant Orders   TSH   Intertrigo       Relevant Medications   clotrimazole-betamethasone (LOTRISONE) cream      1) Mammogram - recommend yearly screening mammogram.  Mammogram Was ordered today  2) STI screening  wasoffered and declined  3) ASCCP guidelines and rational discussed.  Patient  opts for every 5 years screening interval - collected today.  4) Routine healthcare maintenance including cholesterol, diabetes screening discussed managed by PCP  6) Colonoscopy UTD - patient completed last .  Screening recommended starting at age 91 for average risk individuals, age 48 for individuals deemed at increased risk (including African Americans) and recommended to continue until age 9.  For patient age 36-85 individualized approach is recommended.  Gold standard screening is via colonoscopy, Cologuard screening is an acceptable alternative for patient unwilling or unable to undergo colonoscopy.  "Colorectal cancer screening for average?risk adults: 2018 guideline update from the American Cancer Society"CA: A Cancer Journal for Clinicians: Jul 08, 2016   7) No skin changes noted on hands. Discussed OTC tx options for persistent pruritus in hands. Patient to try and follow-up with PCP if sx do not resolve.  8) Return in about 1 year (around 04/16/2021), or if symptoms worsen or fail to improve.    Orlie Pollen, CNM, MSN  [facility] Pigeon, [facility] 04/16/2020, 11:18 AM

## 2020-04-17 LAB — TSH: TSH: 2.57 u[IU]/mL (ref 0.450–4.500)

## 2020-04-18 ENCOUNTER — Other Ambulatory Visit: Payer: Self-pay | Admitting: Obstetrics and Gynecology

## 2020-04-18 DIAGNOSIS — Z1231 Encounter for screening mammogram for malignant neoplasm of breast: Secondary | ICD-10-CM

## 2020-04-22 LAB — CYTOLOGY - PAP
Adequacy: ABNORMAL
Comment: NEGATIVE

## 2020-04-26 ENCOUNTER — Encounter: Payer: Self-pay | Admitting: Nurse Practitioner

## 2020-04-26 ENCOUNTER — Ambulatory Visit: Payer: BC Managed Care – PPO | Admitting: Nurse Practitioner

## 2020-04-26 VITALS — BP 120/72 | HR 91 | Ht 63.0 in | Wt 154.2 lb

## 2020-04-26 DIAGNOSIS — M7918 Myalgia, other site: Secondary | ICD-10-CM | POA: Diagnosis not present

## 2020-04-26 MED ORDER — METHOCARBAMOL 750 MG PO TABS
750.0000 mg | ORAL_TABLET | Freq: Every day | ORAL | 0 refills | Status: DC
Start: 2020-04-26 — End: 2021-09-11

## 2020-04-26 NOTE — Progress Notes (Signed)
ASSESSMENT AND PLAN    # 55 year old female with chronic intermittent " RUQ" pain since 2017.  Pain most likely musculoskeletal in nature.  Pain is worse with sitting, it is not related to eating. It helps to splint area by laying on right side. RUQ [place] in 2018 for the same pain was negative.  --Stay on Nexium 40 mg q am --Robaxin 750 mg at bedtime x 7 days --Ibuprofen 600 mg TID x 7 days --Salon Pas patch to affected area as directed x 7 days.  --Call us with an update in 7- 10 days with an update.  # Hx of H.pylori (IgG) in 2018.  She completed antibiotics.  --At some point we can check for eradication but I do not want to stop PPI right now  since I am giving her NSAIDs for probable musculoskeletal pain  # GERD with heartburn and regurgitation.  Symptoms overall controlled with daily PPI.  Spicy foods and citric foods can exacerbate her symptoms --PPI was recently increased due to her complaints of RUQ pain.  Once her work-up is complete she can resume previous dose  #Colon cancer screening.  She is up-to-date. --10-year interval colonoscopy due 2029  [facility]     Primary Gastroenterologist : Harl Bowie, MD  Chief Complaint :RUQ pain   LAMOINE FREDRICKSEN is a 55 y.o. female with past medical history of GERD, PE   Here for RUQ pain, radiating through to back. Pain originally started in 2017 when she was commuting every day from [place] to [facility]  . Patient told it was probably an ulcer, her H.pylori was positive. She completed antibiotics. Pain didn't improved so she got an [place] which was normal. Pain got better after starting daily PPI. She stayed on a PPI and did okay until a few weeks ago. PCP increased Nexium to 40 mg. The pain is still intermittent and burning.  Also sitting exacerbates the pain. Feels like right front and side ribcage is sore. Laying on left side makes it worse. No significant nausea. Bowel movements are at baseline. No  significant NSAID use. No blood in stool.  No unintentional weight loss.   Patient has a history of GERD with regurgitation and heartburn. Citrius foods and sometimes spicey foods exacerbates symptoms    03/29/20  CMP ok CBC ok   Past Medical History:  Diagnosis Date  . Acute sinusitis, unspecified   . Allergic rhinitis, cause unspecified   . Allergy   . GERD (gastroesophageal reflux disease)   . Personal history of other genital system and obstetric disorders(V13.29)   . Pulmonary embolism ([place])    8 yrs agp- on ASA  . Tobacco use disorder     Current Medications, Allergies, Past Surgical History, Family History and Social History were reviewed in [employer] record.   Current Outpatient Medications  Medication Sig Dispense Refill  . albuterol (PROVENTIL HFA;VENTOLIN HFA) 108 (90 Base) MCG/ACT inhaler Inhale 2 puffs into the lungs every 6 (six) hours as needed for wheezing or shortness of breath. 1 Inhaler 1  . aspirin EC 81 MG tablet Take 81 mg by mouth at bedtime.     Marland Kitchen azelastine (ASTELIN) 0.1 % nasal spray Place into the nose.    . cetirizine (ZYRTEC) 10 MG tablet Take 10 mg by mouth daily as needed (for seasonal allergies).     . Cholecalciferol (VITAMIN D-3) 25 MCG (1000 UT) CAPS Take 1,000 Units by mouth daily.    Marland Kitchen  clotrimazole-betamethasone (LOTRISONE) cream Apply 1 application topically 2 (two) times daily. 30 g 0  . esomeprazole (NEXIUM) 40 MG capsule Take 40 mg by mouth daily.    . fluticasone (FLONASE) 50 MCG/ACT nasal spray Place 2 sprays into both nostrils daily as needed (for seasonal allergies).     . Omega-3 Fatty Acids (OMEGA-3 FISH OIL PO) Take 1 capsule by mouth daily with breakfast.    . vitamin C (ASCORBIC ACID) 500 MG tablet Take 500 mg by mouth daily.    Marland Kitchen VITAMIN E PO Take 1 capsule by mouth daily.     No current facility-administered medications for this visit.    Review of Systems: No chest pain. No shortness of breath. No  urinary complaints.   PHYSICAL EXAM :    Wt Readings from Last 3 Encounters:  04/26/20 154 lb 3.2 oz (69.9 kg)  04/16/20 154 lb 6 oz (70 kg)  03/29/20 152 lb (68.9 kg)    BP 120/72   Pulse 91   Ht 5\' 3"  (1.6 m)   Wt 154 lb 3.2 oz (69.9 kg)   SpO2 99%   BMI 27.32 kg/m  Constitutional:  Pleasant female in no acute distress. Psychiatric: Normal mood and affect. Behavior is normal. EENT: Pupils normal.  Conjunctivae are normal. No scleral icterus. Neck supple.  Cardiovascular: Normal rate, regular rhythm. No edema Pulmonary/chest: Effort normal and breath sounds normal. No wheezing, rales or rhonchi. Abdominal: Soft, nondistended, nontender. Bowel sounds active throughout. There are no masses palpable. No hepatomegaly. Musculoskeletal: No appreciable rib cage tenderness Neurological: Alert and oriented to person place and time. Skin: Skin is warm and dry. No rashes noted.  Tye Savoy, NP  04/26/2020, 9:09 AM  Cc:  Eliezer Lofts, MD

## 2020-04-26 NOTE — Patient Instructions (Addendum)
It was a pleasure to meet you today. Based on our discussion, I am providing you with my recommendations below:  RECOMMENDATION(S):   . Continue Nexium 40 mg daily.  . Start Robaxin 750 mg nightly for 7 days. . Start Ibuprofen 600 mg three times daily for 7 days. . Trial of Salonpas patches for 7 days. . Call with an update in 7-10 days, ask for [facility]   BMI:  . If you are age 55 or older, your body mass index should be between 23-30. Your Body mass index is 27.32 kg/m. If this is out of the aforementioned range listed, please consider follow up with your Primary Care Provider.  . If you are age 39 or younger, your body mass index should be between 19-25. Your Body mass index is 27.32 kg/m. If this is out of the aformentioned range listed, please consider follow up with your Primary Care Provider.   Thank you for trusting me with your gastrointestinal care!    Tye Savoy, NP

## 2020-05-09 ENCOUNTER — Ambulatory Visit
Admission: RE | Admit: 2020-05-09 | Discharge: 2020-05-09 | Disposition: A | Discharge status: Home / Self Care | Payer: BC Managed Care – PPO | Source: Ambulatory Visit | Attending: Obstetrics and Gynecology | Admitting: Obstetrics and Gynecology

## 2020-05-09 ENCOUNTER — Other Ambulatory Visit: Payer: Self-pay

## 2020-05-09 DIAGNOSIS — Z1231 Encounter for screening mammogram for malignant neoplasm of breast: Secondary | ICD-10-CM | POA: Insufficient documentation

## 2020-06-01 NOTE — Progress Notes (Signed)
Reviewed and agree with documentation and assessment and plan. K. Veena Darric Plante , MD   

## 2020-06-07 ENCOUNTER — Telehealth: Payer: Self-pay | Admitting: Family Medicine

## 2020-06-07 MED ORDER — ALBUTEROL SULFATE HFA 108 (90 BASE) MCG/ACT IN AERS: 2.0000 | INHALATION_SPRAY | Freq: Four times a day (QID) | RESPIRATORY_TRACT | 0 refills | Status: DC | PRN

## 2020-06-07 NOTE — Addendum Note (Signed)
Addended by: Carter Kitten on: 06/07/2020 01:17 PM   Modules accepted: Orders

## 2020-06-07 NOTE — Telephone Encounter (Signed)
  LAST APPOINTMENT DATE: 03/29/2020   NEXT APPOINTMENT DATE:@Visit  date not found  MEDICATION: albuterol  PHARMACY: [facility] [address]  Let patient know to contact pharmacy at the end of the day to make sure medication is ready.  Please notify patient to allow 48-72 hours to process  Encourage patient to contact the pharmacy for refills or they can request refills through [place]:   LAST REFILL:  QTY:  REFILL DATE:    OTHER COMMENTS:    Okay for refill?  Please advise

## 2020-07-04 ENCOUNTER — Other Ambulatory Visit: Payer: Self-pay | Admitting: Family Medicine

## 2020-07-04 NOTE — Telephone Encounter (Signed)
Please schedule CPE with fasting labs prior with Dr. Diona Browner.  Send back to me once scheduled to refill her medication.

## 2020-07-04 NOTE — Telephone Encounter (Signed)
Spoke with patient scheduled CPE with fasting labs 

## 2020-07-31 ENCOUNTER — Other Ambulatory Visit: Payer: Self-pay | Admitting: Family Medicine

## 2020-08-06 ENCOUNTER — Telehealth: Payer: Self-pay | Admitting: Family Medicine

## 2020-08-06 ENCOUNTER — Other Ambulatory Visit: Payer: Self-pay

## 2020-08-06 ENCOUNTER — Other Ambulatory Visit (INDEPENDENT_AMBULATORY_CARE_PROVIDER_SITE_OTHER): Payer: BC Managed Care – PPO

## 2020-08-06 DIAGNOSIS — Z131 Encounter for screening for diabetes mellitus: Secondary | ICD-10-CM

## 2020-08-06 DIAGNOSIS — Z13220 Encounter for screening for lipoid disorders: Secondary | ICD-10-CM | POA: Diagnosis not present

## 2020-08-06 LAB — COMPREHENSIVE METABOLIC PANEL
ALT: 17 U/L (ref 0–35)
AST: 18 U/L (ref 0–37)
Albumin: 4.7 g/dL (ref 3.5–5.2)
Alkaline Phosphatase: 76 U/L (ref 39–117)
BUN: 12 mg/dL (ref 6–23)
CO2: 27 mEq/L (ref 19–32)
Calcium: 9.7 mg/dL (ref 8.4–10.5)
Chloride: 105 mEq/L (ref 96–112)
Creatinine, Ser: 0.81 mg/dL (ref 0.40–1.20)
GFR: 82.21 mL/min (ref >60.00)
Glucose, Bld: 96 mg/dL (ref 70–99)
Potassium: 4.4 mEq/L (ref 3.5–5.1)
Sodium: 140 mEq/L (ref 135–145)
Total Bilirubin: 0.7 mg/dL (ref 0.2–1.2)
Total Protein: 7 g/dL (ref 6.0–8.3)

## 2020-08-06 LAB — HEMOGLOBIN A1C: Hgb A1c MFr Bld: 6 % (ref 4.6–6.5)

## 2020-08-06 LAB — LIPID PANEL
Cholesterol: 211 mg/dL — ABNORMAL HIGH (ref 0–200)
HDL: 56.5 mg/dL (ref >39.00)
LDL Cholesterol: 134 mg/dL — ABNORMAL HIGH (ref 0–99)
NonHDL: 154.69
Total CHOL/HDL Ratio: 4
Triglycerides: 101 mg/dL (ref 0.0–149.0)
VLDL: 20.2 mg/dL (ref 0.0–40.0)

## 2020-08-06 NOTE — Telephone Encounter (Signed)
-----   Message from Cloyd Stagers, RT sent at 07/22/2020  1:28 PM EDT ----- Regarding: Lab Orders for Tuesday 6.28.2022 Please place lab orders for Tuesday 6.28.2022, office visit for physical on Tuesday 7.5.2022 Thank you, Dyke Maes RT(R)

## 2020-08-06 NOTE — Progress Notes (Signed)
No critical labs need to be addressed urgently. We will discuss labs in detail at upcoming office visit.   

## 2020-08-13 ENCOUNTER — Encounter: Payer: Self-pay | Admitting: Family Medicine

## 2020-08-13 ENCOUNTER — Other Ambulatory Visit: Payer: Self-pay

## 2020-08-13 ENCOUNTER — Ambulatory Visit (INDEPENDENT_AMBULATORY_CARE_PROVIDER_SITE_OTHER): Payer: BC Managed Care – PPO | Admitting: Family Medicine

## 2020-08-13 VITALS — BP 110/60 | HR 84 | Temp 98.0°F | Ht 63.0 in | Wt 157.8 lb

## 2020-08-13 DIAGNOSIS — G2581 Restless legs syndrome: Secondary | ICD-10-CM | POA: Diagnosis not present

## 2020-08-13 DIAGNOSIS — R0683 Snoring: Secondary | ICD-10-CM | POA: Diagnosis not present

## 2020-08-13 DIAGNOSIS — Z0000 Encounter for general adult medical examination without abnormal findings: Secondary | ICD-10-CM

## 2020-08-13 MED ORDER — ROPINIROLE HCL 0.25 MG PO TABS: 0.2500 mg | ORAL_TABLET | Freq: Every day | ORAL | 11 refills | Status: DC

## 2020-08-13 NOTE — Progress Notes (Signed)
Patient ID: Anita White, female    DOB: 1965/02/24, 56 y.o.   MRN: [id number]  This visit was conducted in person.  BP 110/60   Pulse 84   Temp 98 F (36.7 C) (Temporal)   Ht 5\' 3"  (1.6 m)   Wt 157 lb 12 oz (71.6 kg)   SpO2 99%   BMI 27.94 kg/m    CC: Chief Complaint  Patient presents with   Annual Exam    Subjective:   HPI: Anita White is a 55 y.o. female presenting on 08/13/2020 for Annual Exam   She does feel tired during the day, wakes up tired.  She does have some ache in hips at night... does get 7-8 hour of sleep at night.  Has creepy crawly feeling in legs.. feels better to move.  Notes at bed and when sits a long time.  No depression, minimal anxiety. She snores at night.. some sudden gulps of air. More in last year.  Using flonase.   Nml TSH. Hg nml 03/2020 Exercise: minimal  Diet: moderate  Lab Results  Component Value Date   CHOL 211 (H) 08/06/2020   HDL 56.50 08/06/2020   LDLCALC 134 (H) 08/06/2020   TRIG 101.0 08/06/2020   CHOLHDL 4 08/06/2020   The 10-year ASCVD risk score Mikey Bussing DC Jr., et al., 2013) is: 3.5%   Values used to calculate the score:     Age: 29 years     Sex: Female     Is Non-Hispanic African American: No     Diabetic: No     Tobacco smoker: Yes     Systolic Blood Pressure: 829 mmHg     Is BP treated: No     HDL Cholesterol: 56.5 mg/dL     Total Cholesterol: 211 mg/dL  BP Readings from Last 3 Encounters:  08/13/20 110/60  04/26/20 120/72  04/16/20 100/66        Relevant past medical, surgical, family and social history reviewed and updated as indicated. Interim medical history since our last visit reviewed. Allergies and medications reviewed and updated. Outpatient Medications Prior to Visit  Medication Sig Dispense Refill   albuterol (VENTOLIN HFA) 108 (90 Base) MCG/ACT inhaler INHALE 2 PUFFS BY MOUTH EVERY 6 HOURS AS NEEDED FOR WHEEZE OR SHORTNESS OF BREATH 6.7 each 0   aspirin EC 81 MG tablet Take 81 mg  by mouth at bedtime.      azelastine (ASTELIN) 0.1 % nasal spray Place into the nose.     cetirizine (ZYRTEC) 10 MG tablet Take 10 mg by mouth daily as needed (for seasonal allergies).      Cholecalciferol (VITAMIN D-3) 25 MCG (1000 UT) CAPS Take 1,000 Units by mouth daily.     clotrimazole-betamethasone (LOTRISONE) cream Apply 1 application topically 2 (two) times daily. 30 g 0   esomeprazole (NEXIUM) 40 MG capsule Take 40 mg by mouth daily.     fluticasone (FLONASE) 50 MCG/ACT nasal spray Place 2 sprays into both nostrils daily as needed (for seasonal allergies).      methocarbamol (ROBAXIN-750) 750 MG tablet Take 1 tablet (750 mg total) by mouth at bedtime. 7 tablet 0   Omega-3 Fatty Acids (OMEGA-3 FISH OIL PO) Take 1 capsule by mouth daily with breakfast.     vitamin C (ASCORBIC ACID) 500 MG tablet Take 500 mg by mouth daily.     VITAMIN E PO Take 1 capsule by mouth daily.     No facility-administered medications prior to  visit.     Per HPI unless specifically indicated in ROS section below Review of Systems  Constitutional:  Negative for fatigue and fever.  HENT:  Negative for ear pain.   Eyes:  Negative for pain.  Respiratory:  Negative for chest tightness and shortness of breath.   Cardiovascular:  Negative for chest pain, palpitations and leg swelling.  Gastrointestinal:  Negative for abdominal pain.  Genitourinary:  Negative for dysuria.  Objective:  BP 110/60   Pulse 84   Temp 98 F (36.7 C) (Temporal)   Ht 5\' 3"  (1.6 m)   Wt 157 lb 12 oz (71.6 kg)   SpO2 99%   BMI 27.94 kg/m   Wt Readings from Last 3 Encounters:  08/13/20 157 lb 12 oz (71.6 kg)  04/26/20 154 lb 3.2 oz (69.9 kg)  04/16/20 154 lb 6 oz (70 kg)      Physical Exam Constitutional:      General: She is not in acute distress.    Appearance: Normal appearance. She is well-developed. She is not ill-appearing or toxic-appearing.  HENT:     Head: Normocephalic.     Right Ear: Hearing, tympanic membrane,  ear canal and external ear normal. Tympanic membrane is not erythematous, retracted or bulging.     Left Ear: Hearing, tympanic membrane, ear canal and external ear normal. Tympanic membrane is not erythematous, retracted or bulging.     Nose: No mucosal edema or rhinorrhea.     Right Sinus: No maxillary sinus tenderness or frontal sinus tenderness.     Left Sinus: No maxillary sinus tenderness or frontal sinus tenderness.     Mouth/Throat:     Pharynx: Uvula midline.  Eyes:     General: Lids are normal. Lids are everted, no foreign bodies appreciated.     Conjunctiva/sclera: Conjunctivae normal.     Pupils: Pupils are equal, round, and reactive to light.  Neck:     Thyroid: No thyroid mass or thyromegaly.     Vascular: No carotid bruit.     Trachea: Trachea normal.  Cardiovascular:     Rate and Rhythm: Normal rate and regular rhythm.     Pulses: Normal pulses.     Heart sounds: Normal heart sounds, S1 normal and S2 normal. No murmur heard.   No friction rub. No gallop.  Pulmonary:     Effort: Pulmonary effort is normal. No tachypnea or respiratory distress.     Breath sounds: Normal breath sounds. No decreased breath sounds, wheezing, rhonchi or rales.  Abdominal:     General: Bowel sounds are normal.     Palpations: Abdomen is soft.     Tenderness: There is no abdominal tenderness.  Musculoskeletal:     Cervical back: Normal range of motion and neck supple.  Skin:    General: Skin is warm and dry.     Findings: No rash.  Neurological:     Mental Status: She is alert.  Psychiatric:        Mood and Affect: Mood is not anxious or depressed.        Speech: Speech normal.        Behavior: Behavior normal. Behavior is cooperative.        Thought Content: Thought content normal.        Judgment: Judgment normal.      Results for orders placed or performed in visit on 08/06/20  Comprehensive metabolic panel  Result Value Ref Range   Sodium 140 135 - 145 mEq/L  Potassium 4.4  3.5 - 5.1 mEq/L   Chloride 105 96 - 112 mEq/L   CO2 27 19 - 32 mEq/L   Glucose, Bld 96 70 - 99 mg/dL   BUN 12 6 - 23 mg/dL   Creatinine, Ser 0.81 0.40 - 1.20 mg/dL   Total Bilirubin 0.7 0.2 - 1.2 mg/dL   Alkaline Phosphatase 76 39 - 117 U/L   AST 18 0 - 37 U/L   ALT 17 0 - 35 U/L   Total Protein 7.0 6.0 - 8.3 g/dL   Albumin 4.7 3.5 - 5.2 g/dL   GFR 82.21 >60.00 mL/min   Calcium 9.7 8.4 - 10.5 mg/dL  Lipid panel  Result Value Ref Range   Cholesterol 211 (H) 0 - 200 mg/dL   Triglycerides 101.0 0.0 - 149.0 mg/dL   HDL 56.50 >39.00 mg/dL   VLDL 20.2 0.0 - 40.0 mg/dL   LDL Cholesterol 134 (H) 0 - 99 mg/dL   Total CHOL/HDL Ratio 4    NonHDL 154.69   Hemoglobin A1c  Result Value Ref Range   Hgb A1c MFr Bld 6.0 4.6 - 6.5 %    This visit occurred during the SARS-CoV-2 public health emergency.  Safety protocols were in place, including screening questions prior to the visit, additional usage of staff PPE, and extensive cleaning of exam room while observing appropriate contact time as indicated for disinfecting solutions.   COVID 19 screen:  No recent travel or known exposure to COVID19 The patient denies respiratory symptoms of COVID 19 at this time. The importance of social distancing was discussed today.   Assessment and Plan   The patient's preventative maintenance and recommended screening tests for an annual wellness exam were reviewed in full today. Brought up to date unless services declined.  Counselled on the importance of diet, exercise, and its role in overall health and mortality. The patient's FH and SH was reviewed, including their home life, tobacco status, and drug and alcohol status.   Vaccines: Tdap uptodate, COVID x 3, consider shingrix PAP/DVE:  Per GYN, 3/8/202 need repeat given scant cells. Mammo:  per GYN 3/31/202 Colon:  2019 Smoking Status:smoker.. Considering chantix. Spent 5 minutes discussing cessation techniques, plan 10 year ETOH/ drug  CNO:BSJG/GEZM  Hep C:due  Problem List Items Addressed This Visit     RLS (restless legs syndrome)    Trial of Requip.        Snoring   Other Visit Diagnoses     Routine general medical examination at a health care facility    -  Primary      Meds ordered this encounter  Medications   rOPINIRole (REQUIP) 0.25 MG tablet    Sig: Take 1 tablet (0.25 mg total) by mouth at bedtime. If not improving after 1-2 week increase to 2 tabs at bedtime.    Dispense:  30 tablet    Refill:  [place], MD

## 2020-08-13 NOTE — Patient Instructions (Addendum)
Start medication for restless leg syndrome. Start 0.25 mg at  bedtime.. If not improving after 1-2 week increase to 2 tabs at bedtime. Consider wearing compression when up feet during the day.  Start B12 1000 mcg daily.  We will consider future sleep study for snoring to evaluate for sleep apnea. Increase exercise and work on low carb diet. Quit smoking.

## 2020-09-30 ENCOUNTER — Ambulatory Visit
Admission: RE | Admit: 2020-09-30 | Discharge: 2020-09-30 | Disposition: A | Discharge status: Home / Self Care | Payer: BC Managed Care – PPO | Source: Ambulatory Visit | Attending: Nurse Practitioner | Admitting: Nurse Practitioner

## 2020-09-30 ENCOUNTER — Encounter: Payer: Self-pay | Admitting: Nurse Practitioner

## 2020-09-30 ENCOUNTER — Other Ambulatory Visit: Payer: Self-pay

## 2020-09-30 ENCOUNTER — Ambulatory Visit: Payer: BC Managed Care – PPO | Admitting: Nurse Practitioner

## 2020-09-30 ENCOUNTER — Ambulatory Visit
Admission: RE | Admit: 2020-09-30 | Discharge: 2020-09-30 | Disposition: A | Discharge status: Home / Self Care | Payer: BC Managed Care – PPO | Attending: Nurse Practitioner | Admitting: Nurse Practitioner

## 2020-09-30 VITALS — BP 114/74 | HR 76 | Temp 98.1°F | Resp 12 | Ht 63.0 in

## 2020-09-30 DIAGNOSIS — M25461 Effusion, right knee: Secondary | ICD-10-CM | POA: Insufficient documentation

## 2020-09-30 DIAGNOSIS — W19XXXA Unspecified fall, initial encounter: Secondary | ICD-10-CM | POA: Insufficient documentation

## 2020-09-30 DIAGNOSIS — Y939 Activity, unspecified: Secondary | ICD-10-CM | POA: Insufficient documentation

## 2020-09-30 DIAGNOSIS — M25561 Pain in right knee: Secondary | ICD-10-CM | POA: Diagnosis not present

## 2020-09-30 DIAGNOSIS — Y929 Unspecified place or not applicable: Secondary | ICD-10-CM | POA: Insufficient documentation

## 2020-09-30 MED ORDER — KETOROLAC TROMETHAMINE 60 MG/2ML IM SOLN
60.0000 mg | Freq: Once | INTRAMUSCULAR | Status: AC
  Administered 2020-09-30: 60 mg via INTRAMUSCULAR

## 2020-09-30 NOTE — Assessment & Plan Note (Signed)
Was at playground on pole when she slid and fell 6 feet landing on the plantar surface of right leg patient experienced instant pain, heard popping, burning, and stinging.  Patient has been using over-the-counter Tylenol, ice, heat with minimal relief.  She is unable to bear weight due to pain.  Pending x-ray, gave crutches with instruction in office.

## 2020-09-30 NOTE — Patient Instructions (Signed)
AVOID ibuprofen, motrin, naproxen,and aspirin for the next 12 hours. It is ok to take tylenol if needed. Elevate it and use ice. Crutches as tolerated. I will be in touch after you get your xrays

## 2020-09-30 NOTE — Progress Notes (Signed)
Acute Office Visit  Subjective:    Patient ID: Anita White, female    DOB: 10/15/1965, 55 y.o.   MRN: [id number]  Chief Complaint  Patient presents with   [place] straight down about 6 feet from a pole at a playground on 09/29/20, patient heard a pop in her right knee. Had burning and stinging sensation. Pain increased as the day went by, swelling present. Not able to put any weight on her leg.    HPI Patient is in today for Fall with right knee pain  States she fell striaght down on plantar surface for apprx 6 feet onto mulch. Felt a pop and stinging and burning. Has tried tylenol with minimal relief. No ibuprofen. Has also used heat, ice, and elevation. Cant bear weight due to pain. Limited ROM due to pain and swelling. In wheel chair in office. States have been using office chair at home.  Past Medical History:  Diagnosis Date   Acute sinusitis, unspecified    Allergic rhinitis, cause unspecified    Allergy    GERD (gastroesophageal reflux disease)    Personal history of other genital system and obstetric disorders(V13.29)    Pulmonary embolism ([place])    8 yrs agp- on ASA   Tobacco use disorder     Past Surgical History:  Procedure Laterality Date   APPENDECTOMY  2004   TONSILLECTOMY  2004   TUBAL LIGATION      Family History  Problem Relation Age of Onset   Colon cancer Neg Hx    Colon polyps Neg Hx    Esophageal cancer Neg Hx    Stomach cancer Neg Hx    Rectal cancer Neg Hx    Breast cancer Neg Hx     Social History   Socioeconomic History   Marital status: Married    Spouse name: Not on file   Number of children: 1   Years of education: Not on file   Highest education level: Not on file  Occupational History   Occupation: claims Rep for insurance    Employer: [employer]  Tobacco Use   Smoking status: Every Day    Packs/day: 0.25    Years: 25.00    Pack years: 6.25    Types: Cigarettes    Start date: 07/29/2017   Smokeless tobacco:  Never  Vaping Use   Vaping Use: Never used  Substance and Sexual Activity   Alcohol use: Yes    Comment: occasional   Drug use: No   Sexual activity: Not Currently    Birth control/protection: Post-menopausal  Other Topics Concern   Not on file  Social History Narrative   Exercise--yes--golf 3 times weekly, walks 1 mile a day      Diet--3 meals veggies, no fruit, +caffine some h20   Social Determinants of Health   Financial Resource Strain: Not on file  Food Insecurity: Not on file  Transportation Needs: Not on file  Physical Activity: Not on file  Stress: Not on file  Social Connections: Not on file  Intimate Partner Violence: Not on file    Outpatient Medications Prior to Visit  Medication Sig Dispense Refill   albuterol (VENTOLIN HFA) 108 (90 Base) MCG/ACT inhaler INHALE 2 PUFFS BY MOUTH EVERY 6 HOURS AS NEEDED FOR WHEEZE OR SHORTNESS OF BREATH 6.7 each 0   aspirin EC 81 MG tablet Take 81 mg by mouth at bedtime.      azelastine (ASTELIN) 0.1 % nasal spray Place  into the nose.     cetirizine (ZYRTEC) 10 MG tablet Take 10 mg by mouth daily as needed (for seasonal allergies).      Cholecalciferol (VITAMIN D-3) 25 MCG (1000 UT) CAPS Take 1,000 Units by mouth daily.     clotrimazole-betamethasone (LOTRISONE) cream Apply 1 application topically 2 (two) times daily. 30 g 0   esomeprazole (NEXIUM) 40 MG capsule Take 40 mg by mouth daily.     fluticasone (FLONASE) 50 MCG/ACT nasal spray Place 2 sprays into both nostrils daily as needed (for seasonal allergies).      methocarbamol (ROBAXIN-750) 750 MG tablet Take 1 tablet (750 mg total) by mouth at bedtime. 7 tablet 0   Omega-3 Fatty Acids (OMEGA-3 FISH OIL PO) Take 1 capsule by mouth daily with breakfast.     rOPINIRole (REQUIP) 0.25 MG tablet Take 1 tablet (0.25 mg total) by mouth at bedtime. If not improving after 1-2 week increase to 2 tabs at bedtime. 30 tablet 11   vitamin C (ASCORBIC ACID) 500 MG tablet Take 500 mg by mouth  daily.     VITAMIN E PO Take 1 capsule by mouth daily.     No facility-administered medications prior to visit.    Allergies  Allergen Reactions   Bupropion Hcl Hives   Tomato Other (See Comments)    Heartburn     Review of Systems  Constitutional:  Negative for chills and fever.  Respiratory:  Negative for cough and shortness of breath.   Cardiovascular:  Negative for chest pain and leg swelling.  Gastrointestinal:  Negative for nausea and vomiting.  Musculoskeletal:  Positive for arthralgias, gait problem, joint swelling and myalgias.      Objective:    Physical Exam Vitals and nursing note reviewed.  Constitutional:      Appearance: Normal appearance.  Cardiovascular:     Rate and Rhythm: Normal rate and regular rhythm.  Pulmonary:     Effort: Pulmonary effort is normal.     Breath sounds: Normal breath sounds.  Musculoskeletal:        General: Tenderness, deformity and signs of injury present. No swelling.     Right knee: Swelling present. No deformity, erythema, ecchymosis or crepitus. Decreased range of motion. Tenderness present. No ACL laxity or PCL laxity. Normal alignment. Normal pulse.     Instability Tests: Anterior drawer test negative. Posterior drawer test negative.     Left knee: Normal.       Legs:  Neurological:     Mental Status: She is alert.    BP 114/74   Pulse 76   Temp 98.1 F (36.7 C)   Resp 12   Ht '5\' 3"'$  (1.6 m)   SpO2 96%   BMI 27.94 kg/m  Wt Readings from Last 3 Encounters:  08/13/20 157 lb 12 oz (71.6 kg)  04/26/20 154 lb 3.2 oz (69.9 kg)  04/16/20 154 lb 6 oz (70 kg)    Health Maintenance Due  Topic Date Due   Hepatitis C Screening  Never done   Pneumococcal Vaccine 3-8 Years old (2 - PCV) 12/18/2010   Zoster Vaccines- Shingrix (1 of 2) Never done   COVID-19 Vaccine (4 - Booster for Pfizer series) 05/24/2020   INFLUENZA VACCINE  09/09/2020    There are no preventive care reminders to display for this patient.   Lab  Results  Component Value Date   TSH 2.570 04/16/2020   Lab Results  Component Value Date   WBC 7.4 03/29/2020  HGB 14.6 03/29/2020   HCT 43.1 03/29/2020   MCV 91.3 03/29/2020   PLT 256.0 03/29/2020   Lab Results  Component Value Date   NA 140 08/06/2020   K 4.4 08/06/2020   CO2 27 08/06/2020   GLUCOSE 96 08/06/2020   BUN 12 08/06/2020   CREATININE 0.81 08/06/2020   BILITOT 0.7 08/06/2020   ALKPHOS 76 08/06/2020   AST 18 08/06/2020   ALT 17 08/06/2020   PROT 7.0 08/06/2020   ALBUMIN 4.7 08/06/2020   CALCIUM 9.7 08/06/2020   ANIONGAP 13 09/26/2019   GFR 82.21 08/06/2020   Lab Results  Component Value Date   CHOL 211 (H) 08/06/2020   Lab Results  Component Value Date   HDL 56.50 08/06/2020   Lab Results  Component Value Date   LDLCALC 134 (H) 08/06/2020   Lab Results  Component Value Date   TRIG 101.0 08/06/2020   Lab Results  Component Value Date   CHOLHDL 4 08/06/2020   Lab Results  Component Value Date   HGBA1C 6.0 08/06/2020       Assessment & Plan:   Problem List Items Addressed This Visit       Other   Fall - Primary    Was at playground on pole when she slid and fell 6 feet landing on the plantar surface of right leg patient experienced instant pain, heard popping, burning, and stinging.  Patient has been using over-the-counter Tylenol, ice, heat with minimal relief.  She is unable to bear weight due to pain.  Pending x-ray, gave crutches with instruction in office.      Relevant Orders   DG Knee Complete 4 Views Right   Acute pain of right knee    Post fall.  No ecchymosis, erythema, malalignment.  Patient did have edema she has been using rest, ice, heat, and acetaminophen with minimal relief.  Nonweightbearing in office due to pain and swelling limited range of motion.  Pending x-ray.  Did give Toradol injection in office gave recommendations of no other NSAIDs for 12 hours, patient acknowledged.      Relevant Orders   DG Knee Complete  4 Views Right   DME Crutches     No orders of the defined types were placed in this encounter.  This visit occurred during the SARS-CoV-2 public health emergency.  Safety protocols were in place, including screening questions prior to the visit, additional usage of staff PPE, and extensive cleaning of exam room while observing appropriate contact time as indicated for disinfecting solutions.   Romilda Garret, NP

## 2020-09-30 NOTE — Assessment & Plan Note (Signed)
Post fall.  No ecchymosis, erythema, malalignment.  Patient did have edema she has been using rest, ice, heat, and acetaminophen with minimal relief.  Nonweightbearing in office due to pain and swelling limited range of motion.  Pending x-ray.  Did give Toradol injection in office gave recommendations of no other NSAIDs for 12 hours, patient acknowledged.

## 2020-10-15 DIAGNOSIS — Z20822 Contact with and (suspected) exposure to covid-19: Secondary | ICD-10-CM | POA: Diagnosis not present

## 2020-10-17 ENCOUNTER — Telehealth: Payer: Self-pay | Admitting: Family Medicine

## 2020-10-17 NOTE — Telephone Encounter (Signed)
[facility] - Client TELEPHONE ADVICE RECORD Access[employer]urse® Patient Name: Anita BE White Gender: Female DOB: 10/01/1965 Age: 54 Y 9 M 5 D Return Phone Number: [phone] (Primary) Address: City/ State/ Zip: [place] [address] Client [facility] - Client Client Site  [facility] Day Physician Bedsole, Amy - MD Contact Type Call Who Is Calling Patient / Member / Family / Caregiver Call Type Triage / Clinical Relationship To Patient Self Return Phone Number [phone] (Primary) Chief Complaint Diarrhea Reason for Call Symptomatic / Request for Health Information Initial Comment Caller states has tested pos for COVID19 [facility] yesterday; sx began late Monday; headache and chills Mon and Tues; today; diarrhea cough and fatigue; no appts; Translation No Nurse Assessment Nurse: Henson, RN, Eunice Date/Time (Eastern Time): 10/17/2020 1:25:06 PM Confirm and document reason for call. If symptomatic, describe symptoms. ---Caller states has tested pos for COVID19 [facility] yesterday; sx began late Monday; headache and chills Mon today; diarrhea cough and fatigue; no appts; Does the patient have any new or worsening symptoms? ---Yes Will a triage be completed? ---Yes Related visit to physician within the last 2 weeks? ---No Does the PT have any chronic conditions? (i.e. diabetes, asthma, this includes High risk factors for pregnancy, etc.) ---Yes List chronic conditions. ---Smokes 1/2 pack a day. for 28 years. Is the patient pregnant or possibly pregnant? (Ask all females between the ages of 12-55) ---No Is this a behavioral health or substance abuse call? ---No Guidelines Guideline Title Affirmed Question Affirmed Notes Nurse Date/Time (Eastern Time) COVID-19 - Diagnosed or Suspected [1] COVID-19 diagnosed by positive lab test (e.g., PCR, rapid self-test kit) AND [2] mild symptoms (e.g.,  cough, fever, Henson, RN, Eunice 10/17/2020 1:27:02 PM PLEASE NOTE: All timestamps contained within this report are represented as Eastern Standard Time. CONFIDENTIALTY NOTICE: This fax transmission is intended only for the addressee. It contains information that is legally privileged, confidential or otherwise protected from use or disclosure. If you are not the intended recipient, you are strictly prohibited from reviewing, disclosing, copying using or disseminating any of this information or taking any action in reliance on or regarding this information. If you have received this fax in error, please notify us immediately by telephone so that we can arrange for its return to us. Phone: [phone], Toll-Free: [phone], Fax: [phone] Page: 2 of 2 Call Id: [id number] Guidelines Guideline Title Affirmed Question Affirmed Notes Nurse Date/Time (Eastern Time) others) AND [3] no complications or SOB Disp. Time (Eastern Time) Disposition Final User 10/17/2020 1:36:45 PM Call PCP within 24 Hours Yes Henson, RN, Eunice Disposition Overriden: Home Care Override Reason: Patient's symptoms need a higher level of care Caller Disagree/Comply Comply Caller Understands Yes PreDisposition Search internet for information Care Advice Given Per Guideline CALL PCP WITHIN 24 HOURS: CALL BACK IF: * You become worse CARE ADVICE given per COVID-19 - DIAGNOSED OR SUSPECTED (Adult) guideline. CALL BACK IF: * Fever over 103 F (39.4 C) * Chest pain or difficulty breathing occurs * You become worse Comments User: Rhonda, Watson Date/Time (Eastern Time): 10/17/2020 1:07:48 PM Caller states Cindy; no appts; User: Eunice, Henson, RN Date/Time (Eastern Time): 10/17/2020 1:36:43 PM Upgraded loc due to patient is a smoker and ran a fever yesterday even though she wasn't able to check it. Also a candidate for the paxlovid if GFR will allow. Going to Urgent Care due to office closure. Referrals GO TO FACILITY  UNDECIDED 

## 2020-10-17 NOTE — Telephone Encounter (Signed)
Per end of access nurse note stated pt would go to UC. Sending note to Dr Dionicio Stall [facility] and sending teams to [facility].

## 2020-10-17 NOTE — Telephone Encounter (Signed)
Noted  

## 2020-10-17 NOTE — Telephone Encounter (Signed)
Patient called in tested positive for Covid was transfer to triage nurse

## 2020-10-18 NOTE — Assessment & Plan Note (Signed)
Trial of Requip.

## 2021-01-12 ENCOUNTER — Encounter: Payer: Self-pay | Admitting: Emergency Medicine

## 2021-01-12 ENCOUNTER — Other Ambulatory Visit: Payer: Self-pay

## 2021-01-12 ENCOUNTER — Ambulatory Visit
Admission: EM | Admit: 2021-01-12 | Discharge: 2021-01-12 | Disposition: A | Discharge status: Home / Self Care | Payer: BC Managed Care – PPO | Attending: Emergency Medicine | Admitting: Emergency Medicine

## 2021-01-12 DIAGNOSIS — B349 Viral infection, unspecified: Secondary | ICD-10-CM

## 2021-01-12 MED ORDER — BENZONATATE 100 MG PO CAPS
100.0000 mg | ORAL_CAPSULE | Freq: Three times a day (TID) | ORAL | 0 refills | Status: DC | PRN
Start: 2021-01-12 — End: 2021-09-11

## 2021-01-12 NOTE — ED Triage Notes (Signed)
Pt here with flu-like sx and fever for 2 days.

## 2021-01-12 NOTE — ED Provider Notes (Signed)
Anita White    CSN: [id number] Arrival date & time: 01/12/21  0825      History   Chief Complaint Chief Complaint  Patient presents with   Cough   Nasal Congestion   Sore Throat   Fever   Chest Congestion    HPI Anita White is a 55 y.o. female.  Patient presents with 2 to 3-day history of fever, congestion, runny nose, cough.  T-max 99.5.  One episode of diarrhea this morning.  She denies rash, shortness of breath, vomiting, or other symptoms.  Treatment at home with OTC sinus medication.  Her medical history includes COVID in September 2022, allergic rhinitis, history of pulmonary embolism, current everyday smoker.  The history is provided by the patient and medical records.   Past Medical History:  Diagnosis Date   Acute sinusitis, unspecified    Allergic rhinitis, cause unspecified    Allergy    GERD (gastroesophageal reflux disease)    Personal history of other genital system and obstetric disorders(V13.29)    Pulmonary embolism ([place])    8 yrs agp- on ASA   Tobacco use disorder     Patient Active Problem List   Diagnosis Date Noted   Fall 09/30/2020   Acute pain of right knee 09/30/2020   Snoring 08/13/2020   RLS (restless legs syndrome) 08/13/2020   Asthmatic bronchitis 11/19/2016   Current smoker 11/12/2016   Fatigue 11/12/2016   RUQ pain 06/16/2016   History of pulmonary embolism 12/23/2009   Allergic dermatitis 03/10/2007   DIABETES MELLITUS, GESTATIONAL, HX OF 03/10/2007    Past Surgical History:  Procedure Laterality Date   APPENDECTOMY  2004   TONSILLECTOMY  2004   TUBAL LIGATION      OB History     Gravida  1   Para  1   Term  1   Preterm      AB      Living  1      SAB      IAB      Ectopic      Multiple      Live Births  1            Home Medications    Prior to Admission medications   Medication Sig Start Date End Date Taking? Authorizing Provider  benzonatate (TESSALON) 100 MG capsule Take 1  capsule (100 mg total) by mouth 3 (three) times daily as needed for cough. 01/12/21  Yes Sharion Balloon, NP  albuterol (VENTOLIN HFA) 108 (90 Base) MCG/ACT inhaler INHALE 2 PUFFS BY MOUTH EVERY 6 HOURS AS NEEDED FOR WHEEZE OR SHORTNESS OF BREATH 07/31/20   Bedsole, Amy E, MD  aspirin EC 81 MG tablet Take 81 mg by mouth at bedtime.     [provider]  azelastine (ASTELIN) 0.1 % nasal spray Place into the nose. 04/30/17   [provider]  cetirizine (ZYRTEC) 10 MG tablet Take 10 mg by mouth daily as needed (for seasonal allergies).     [provider]  Cholecalciferol (VITAMIN D-3) 25 MCG (1000 UT) CAPS Take 1,000 Units by mouth daily.    [provider]  clotrimazole-betamethasone (LOTRISONE) cream Apply 1 application topically 2 (two) times daily. 04/16/20   Orlie Pollen, CNM  esomeprazole (NEXIUM) 40 MG capsule Take 40 mg by mouth daily.    [provider]  fluticasone (FLONASE) 50 MCG/ACT nasal spray Place 2 sprays into both nostrils daily as needed (for seasonal allergies).  07/12/12  Bedsole, Amy E, MD  methocarbamol (ROBAXIN-750) 750 MG tablet Take 1 tablet (750 mg total) by mouth at bedtime. 04/26/20   Willia Craze, NP  Omega-3 Fatty Acids (OMEGA-3 FISH OIL PO) Take 1 capsule by mouth daily with breakfast.    [provider]  rOPINIRole (REQUIP) 0.25 MG tablet Take 1 tablet (0.25 mg total) by mouth at bedtime. If not improving after 1-2 week increase to 2 tabs at bedtime. 08/13/20   Bedsole, Amy E, MD  vitamin C (ASCORBIC ACID) 500 MG tablet Take 500 mg by mouth daily.    [provider]  VITAMIN E PO Take 1 capsule by mouth daily.    [provider]    Family History Family History  Problem Relation Age of Onset   Colon cancer Neg Hx    Colon polyps Neg Hx    Esophageal cancer Neg Hx    Stomach cancer Neg Hx    Rectal cancer Neg Hx    Breast cancer Neg Hx     Social History Social History   Tobacco Use    Smoking status: Every Day    Packs/day: 0.25    Years: 25.00    Pack years: 6.25    Types: Cigarettes    Start date: 07/29/2017   Smokeless tobacco: Never  Vaping Use   Vaping Use: Never used  Substance Use Topics   Alcohol use: Yes    Comment: occasional   Drug use: No     Allergies   Bupropion hcl and Tomato   Review of Systems Review of Systems  Constitutional:  Positive for fever. Negative for chills.  HENT:  Positive for congestion and rhinorrhea. Negative for ear pain and sore throat.   Respiratory:  Positive for cough. Negative for shortness of breath.   Cardiovascular:  Negative for chest pain and palpitations.  Gastrointestinal:  Positive for diarrhea. Negative for abdominal pain and vomiting.  Skin:  Negative for color change and rash.  All other systems reviewed and are negative.   Physical Exam Triage Vital Signs ED Triage Vitals  Enc Vitals Group     BP 01/12/21 0850 131/76     Pulse Rate 01/12/21 0850 95     Resp 01/12/21 0850 18     Temp 01/12/21 0850 98.8 F (37.1 C)     Temp src --      SpO2 01/12/21 0850 98 %     Weight --      Height --      Head Circumference --      Peak Flow --      Pain Score 01/12/21 0851 3     Pain Loc --      Pain Edu? --      Excl. in [place]? --    No data found.  Updated Vital Signs BP 131/76   Pulse 95   Temp 98.8 F (37.1 C)   Resp 18   SpO2 98%   Visual Acuity Right Eye Distance:   Left Eye Distance:   Bilateral Distance:    Right Eye Near:   Left Eye Near:    Bilateral Near:     Physical Exam Vitals and nursing note reviewed.  Constitutional:      General: She is not in acute distress.    Appearance: She is well-developed.  HENT:     Right Ear: Tympanic membrane normal.     Left Ear: Tympanic membrane normal.     Nose: Congestion and rhinorrhea  present.     Mouth/Throat:     Mouth: Mucous membranes are moist.     Pharynx: Oropharynx is clear.  Cardiovascular:     Rate and Rhythm: Normal rate  and regular rhythm.     Heart sounds: Normal heart sounds.  Pulmonary:     Effort: Pulmonary effort is normal. No respiratory distress.     Breath sounds: Normal breath sounds.  Abdominal:     Palpations: Abdomen is soft.     Tenderness: There is no abdominal tenderness.  Musculoskeletal:     Cervical back: Neck supple.  Skin:    General: Skin is warm and dry.  Neurological:     Mental Status: She is alert.  Psychiatric:        Mood and Affect: Mood normal.        Behavior: Behavior normal.     UC Treatments / Results  Labs (all labs ordered are listed, but only abnormal results are displayed) Labs Reviewed  COVID-19, FLU A+B NAA    EKG   Radiology No results found.  Procedures Procedures (including critical care time)  Medications Ordered in UC Medications - No data to display  Initial Impression / Assessment and Plan / UC Course  I have reviewed the triage vital signs and the nursing notes.  Pertinent labs & imaging results that were available during my care of the patient were reviewed by me and considered in my medical decision making (see chart for details).    Viral illness.  COVID and Flu pending.  Instructed patient to self quarantine per CDC guidelines.  Discussed symptomatic treatment including Tessalon Perles, Tylenol or ibuprofen, rest, hydration.  Instructed patient to follow up with PCP if symptoms are not improving.  Patient agrees to plan of care.   Final Clinical Impressions(s) / UC Diagnoses   Final diagnoses:  Viral illness     Discharge Instructions      Your COVID and Flu tests are pending.  You should self quarantine until the test results are back.    Take the [facility] as needed for cough.  Take Tylenol or ibuprofen as needed for fever or discomfort.  Rest and keep yourself hydrated.    Follow-up with your primary care provider if your symptoms are not improving.         ED Prescriptions     Medication Sig Dispense  Auth. Provider   benzonatate (TESSALON) 100 MG capsule Take 1 capsule (100 mg total) by mouth 3 (three) times daily as needed for cough. 21 capsule Sharion Balloon, NP      PDMP not reviewed this encounter.   Sharion Balloon, NP 01/12/21 [phone]

## 2021-01-12 NOTE — Discharge Instructions (Addendum)
Your COVID and Flu tests are pending.  You should self quarantine until the test results are back.    Take the [facility] as needed for cough.  Take Tylenol or ibuprofen as needed for fever or discomfort.  Rest and keep yourself hydrated.    Follow-up with your primary care provider if your symptoms are not improving.

## 2021-01-13 LAB — COVID-19, FLU A+B NAA
Influenza A, NAA: DETECTED — AB
Influenza B, NAA: NOT DETECTED
SARS-CoV-2, NAA: NOT DETECTED

## 2021-03-13 ENCOUNTER — Other Ambulatory Visit: Payer: Self-pay | Admitting: Family Medicine

## 2021-04-11 ENCOUNTER — Other Ambulatory Visit: Payer: Self-pay | Admitting: Family Medicine

## 2021-06-10 ENCOUNTER — Other Ambulatory Visit: Payer: Self-pay | Admitting: Family Medicine

## 2021-07-28 DIAGNOSIS — N9089 Other specified noninflammatory disorders of vulva and perineum: Secondary | ICD-10-CM | POA: Diagnosis not present

## 2021-07-28 DIAGNOSIS — Z6824 Body mass index (BMI) 24.0-24.9, adult: Secondary | ICD-10-CM | POA: Diagnosis not present

## 2021-07-28 DIAGNOSIS — L720 Epidermal cyst: Secondary | ICD-10-CM | POA: Diagnosis not present

## 2021-07-28 DIAGNOSIS — Z01419 Encounter for gynecological examination (general) (routine) without abnormal findings: Secondary | ICD-10-CM | POA: Diagnosis not present

## 2021-07-28 DIAGNOSIS — Z124 Encounter for screening for malignant neoplasm of cervix: Secondary | ICD-10-CM | POA: Diagnosis not present

## 2021-07-28 DIAGNOSIS — Z1231 Encounter for screening mammogram for malignant neoplasm of breast: Secondary | ICD-10-CM | POA: Diagnosis not present

## 2021-07-28 LAB — HM MAMMOGRAPHY

## 2021-07-28 LAB — RESULTS CONSOLE HPV: CHL HPV: NEGATIVE

## 2021-07-28 LAB — HM PAP SMEAR: HM Pap smear: NEGATIVE

## 2021-09-03 ENCOUNTER — Telehealth: Payer: Self-pay | Admitting: Family Medicine

## 2021-09-03 DIAGNOSIS — Z13220 Encounter for screening for lipoid disorders: Secondary | ICD-10-CM

## 2021-09-03 DIAGNOSIS — Z1159 Encounter for screening for other viral diseases: Secondary | ICD-10-CM

## 2021-09-03 NOTE — Telephone Encounter (Signed)
-----   Message from Ellamae Sia sent at 08/25/2021 11:16 AM EDT ----- Regarding: Lab orders for Thursday, 7.27.23 Patient is scheduled for CPX labs, please order future labs, Thanks , Karna Christmas

## 2021-09-04 ENCOUNTER — Other Ambulatory Visit (INDEPENDENT_AMBULATORY_CARE_PROVIDER_SITE_OTHER): Payer: BC Managed Care – PPO

## 2021-09-04 DIAGNOSIS — E785 Hyperlipidemia, unspecified: Secondary | ICD-10-CM

## 2021-09-04 DIAGNOSIS — Z79899 Other long term (current) drug therapy: Secondary | ICD-10-CM

## 2021-09-04 DIAGNOSIS — Z1159 Encounter for screening for other viral diseases: Secondary | ICD-10-CM

## 2021-09-04 DIAGNOSIS — Z13220 Encounter for screening for lipoid disorders: Secondary | ICD-10-CM

## 2021-09-04 DIAGNOSIS — Z131 Encounter for screening for diabetes mellitus: Secondary | ICD-10-CM

## 2021-09-04 LAB — COMPREHENSIVE METABOLIC PANEL
ALT: 13 U/L (ref 0–35)
AST: 15 U/L (ref 0–37)
Albumin: 4.7 g/dL (ref 3.5–5.2)
Alkaline Phosphatase: 74 U/L (ref 39–117)
BUN: 9 mg/dL (ref 6–23)
CO2: 29 mEq/L (ref 19–32)
Calcium: 9.8 mg/dL (ref 8.4–10.5)
Chloride: 103 mEq/L (ref 96–112)
Creatinine, Ser: 0.75 mg/dL (ref 0.40–1.20)
GFR: 89.48 mL/min (ref >60.00)
Glucose, Bld: 95 mg/dL (ref 70–99)
Potassium: 4.7 mEq/L (ref 3.5–5.1)
Sodium: 141 mEq/L (ref 135–145)
Total Bilirubin: 0.7 mg/dL (ref 0.2–1.2)
Total Protein: 6.9 g/dL (ref 6.0–8.3)

## 2021-09-04 LAB — LIPID PANEL
Cholesterol: 221 mg/dL — ABNORMAL HIGH (ref 0–200)
HDL: 65.9 mg/dL (ref >39.00)
LDL Cholesterol: 135 mg/dL — ABNORMAL HIGH (ref 0–99)
NonHDL: 154.94
Total CHOL/HDL Ratio: 3
Triglycerides: 99 mg/dL (ref 0.0–149.0)
VLDL: 19.8 mg/dL (ref 0.0–40.0)

## 2021-09-04 NOTE — Progress Notes (Signed)
No critical labs need to be addressed urgently. We will discuss labs in detail at upcoming office visit.   

## 2021-09-04 NOTE — Telephone Encounter (Signed)
-----   Message from Ellamae Sia sent at 08/25/2021 11:16 AM EDT ----- Regarding: Lab orders for Thursday, 7.27.23 Patient is scheduled for CPX labs, please order future labs, Thanks , Karna Christmas

## 2021-09-05 LAB — HEPATITIS C ANTIBODY: Hepatitis C Ab: NONREACTIVE

## 2021-09-05 NOTE — Progress Notes (Signed)
No critical labs need to be addressed urgently. We will discuss labs in detail at upcoming office visit.   

## 2021-09-06 ENCOUNTER — Other Ambulatory Visit: Payer: Self-pay | Admitting: Family Medicine

## 2021-09-11 ENCOUNTER — Encounter: Payer: Self-pay | Admitting: Family Medicine

## 2021-09-11 ENCOUNTER — Ambulatory Visit (INDEPENDENT_AMBULATORY_CARE_PROVIDER_SITE_OTHER): Payer: BC Managed Care – PPO | Admitting: Family Medicine

## 2021-09-11 VITALS — BP 104/60 | HR 80 | Temp 98.0°F | Ht 63.0 in | Wt 141.4 lb

## 2021-09-11 DIAGNOSIS — Z23 Encounter for immunization: Secondary | ICD-10-CM

## 2021-09-11 DIAGNOSIS — Z0000 Encounter for general adult medical examination without abnormal findings: Secondary | ICD-10-CM | POA: Diagnosis not present

## 2021-09-11 DIAGNOSIS — Z720 Tobacco use: Secondary | ICD-10-CM | POA: Diagnosis not present

## 2021-09-11 MED ORDER — VARENICLINE TARTRATE 0.5 MG PO TABS: 0.5000 mg | ORAL_TABLET | Freq: Two times a day (BID) | ORAL | 5 refills | Status: DC

## 2021-09-11 NOTE — Progress Notes (Signed)
Patient ID: Anita White, female    DOB: 03-Jul-1965, 56 y.o.   MRN: [id number]  This visit was conducted in person.  BP 104/60   Pulse 80   Temp 98 F (36.7 C) (Oral)   Ht '5\' 3"'$  (1.6 m)   Wt 141 lb 6 oz (64.1 kg)   SpO2 97%   BMI 25.04 kg/m    CC:  Chief Complaint  Patient presents with   Annual Exam    Subjective:   HPI: Anita White is a 56 y.o. female presenting on 09/11/2021 for Annual Exam  The patient presents for  complete physical and review of chronic health problems. He/She also has the following acute concerns today: Noted soreness with extension of toes downward, bilateral.. only notes with that movement and swimming. No swelling, no redness. No fall or known injury.  She has not tried anything to treat.  Reviewed labs in detail. Lab Results  Component Value Date   CHOL 221 (H) 09/04/2021   HDL 65.90 09/04/2021   LDLCALC 135 (H) 09/04/2021   TRIG 99.0 09/04/2021   CHOLHDL 3 09/04/2021   The 10-year ASCVD risk score (Arnett DK, et al., 2019) is: 3%   Values used to calculate the score:     Age: 56 years     Sex: Female     Is Non-Hispanic African American: No     Diabetic: No     Tobacco smoker: Yes     Systolic Blood Pressure: 425 mmHg     Is BP treated: No     HDL Cholesterol: 65.9 mg/dL     Total Cholesterol: 221 mg/dL     She has lost 16 lbs I last year Wt Readings from Last 3 Encounters:  09/11/21 141 lb 6 oz (64.1 kg)  08/13/20 157 lb 12 oz (71.6 kg)  04/26/20 154 lb 3.2 oz (69.9 kg)   She has been working on low carb, lots of water, healthy diet.   Relevant past medical, surgical, family and social history reviewed and updated as indicated. Interim medical history since our last visit reviewed. Allergies and medications reviewed and updated. Outpatient Medications Prior to Visit  Medication Sig Dispense Refill   albuterol (VENTOLIN HFA) 108 (90 Base) MCG/ACT inhaler INHALE 2 PUFFS BY MOUTH EVERY 6 HOURS AS NEEDED FOR WHEEZE  OR SHORTNESS OF BREATH 6.7 each 2   aspirin EC 81 MG tablet Take 81 mg by mouth at bedtime.      azelastine (ASTELIN) 0.1 % nasal spray Place 2 sprays into both nostrils 2 (two) times daily as needed.     cetirizine (ZYRTEC) 10 MG tablet Take 10 mg by mouth daily as needed (for seasonal allergies).      Cholecalciferol (VITAMIN D-3) 25 MCG (1000 UT) CAPS Take 1,000 Units by mouth daily.     clotrimazole-betamethasone (LOTRISONE) cream Apply 1 application topically 2 (two) times daily. 30 g 0   esomeprazole (NEXIUM) 40 MG capsule Take 40 mg by mouth daily.     fluticasone (FLONASE) 50 MCG/ACT nasal spray Place 2 sprays into both nostrils daily as needed (for seasonal allergies).      Omega-3 Fatty Acids (OMEGA-3 FISH OIL PO) Take 1 capsule by mouth daily with breakfast.     rOPINIRole (REQUIP) 0.25 MG tablet TAKE 1 TABLET BY MOUTH AT BEDTIME. 90 tablet 0   vitamin C (ASCORBIC ACID) 500 MG tablet Take 500 mg by mouth daily.     VITAMIN E PO Take  1 capsule by mouth daily.     benzonatate (TESSALON) 100 MG capsule Take 1 capsule (100 mg total) by mouth 3 (three) times daily as needed for cough. 21 capsule 0   methocarbamol (ROBAXIN-750) 750 MG tablet Take 1 tablet (750 mg total) by mouth at bedtime. 7 tablet 0   No facility-administered medications prior to visit.     Per HPI unless specifically indicated in ROS section below Review of Systems  Constitutional:  Negative for fatigue and fever.  HENT:  Negative for congestion.   Eyes:  Negative for pain.  Respiratory:  Negative for cough and shortness of breath.   Cardiovascular:  Negative for chest pain, palpitations and leg swelling.  Gastrointestinal:  Negative for abdominal pain.  Genitourinary:  Negative for dysuria and vaginal bleeding.  Musculoskeletal:  Negative for back pain.  Neurological:  Negative for syncope, light-headedness and headaches.  Psychiatric/Behavioral:  Negative for dysphoric mood.    Objective:  BP 104/60   Pulse  80   Temp 98 F (36.7 C) (Oral)   Ht '5\' 3"'$  (1.6 m)   Wt 141 lb 6 oz (64.1 kg)   SpO2 97%   BMI 25.04 kg/m   Wt Readings from Last 3 Encounters:  09/11/21 141 lb 6 oz (64.1 kg)  08/13/20 157 lb 12 oz (71.6 kg)  04/26/20 154 lb 3.2 oz (69.9 kg)      Physical Exam Vitals and nursing note reviewed.  Constitutional:      General: She is not in acute distress.    Appearance: Normal appearance. She is well-developed. She is not ill-appearing or toxic-appearing.  HENT:     Head: Normocephalic.     Right Ear: Hearing, tympanic membrane, ear canal and external ear normal.     Left Ear: Hearing, tympanic membrane, ear canal and external ear normal.     Nose: Nose normal.  Eyes:     General: Lids are normal. Lids are everted, no foreign bodies appreciated.     Conjunctiva/sclera: Conjunctivae normal.     Pupils: Pupils are equal, round, and reactive to light.  Neck:     Thyroid: No thyroid mass or thyromegaly.     Vascular: No carotid bruit.     Trachea: Trachea normal.  Cardiovascular:     Rate and Rhythm: Normal rate and regular rhythm.     Heart sounds: Normal heart sounds, S1 normal and S2 normal. No murmur heard.    No gallop.  Pulmonary:     Effort: Pulmonary effort is normal. No respiratory distress.     Breath sounds: Normal breath sounds. No wheezing, rhonchi or rales.  Abdominal:     General: Bowel sounds are normal. There is no distension or abdominal bruit.     Palpations: Abdomen is soft. There is no fluid wave or mass.     Tenderness: There is no abdominal tenderness. There is no guarding or rebound.     Hernia: No hernia is present.  Musculoskeletal:     Cervical back: Normal range of motion and neck supple.  Lymphadenopathy:     Cervical: No cervical adenopathy.  Skin:    General: Skin is warm and dry.     Findings: No rash.  Neurological:     Mental Status: She is alert.     Cranial Nerves: No cranial nerve deficit.     Sensory: No sensory deficit.   Psychiatric:        Mood and Affect: Mood is not anxious or depressed.  Speech: Speech normal.        Behavior: Behavior normal. Behavior is cooperative.        Judgment: Judgment normal.       Results for orders placed or performed in visit on 09/04/21  Hepatitis C antibody  Result Value Ref Range   Hepatitis C Ab NON-REACTIVE NON-REACTIVE  Comprehensive metabolic panel  Result Value Ref Range   Sodium 141 135 - 145 mEq/L   Potassium 4.7 3.5 - 5.1 mEq/L   Chloride 103 96 - 112 mEq/L   CO2 29 19 - 32 mEq/L   Glucose, Bld 95 70 - 99 mg/dL   BUN 9 6 - 23 mg/dL   Creatinine, Ser 0.75 0.40 - 1.20 mg/dL   Total Bilirubin 0.7 0.2 - 1.2 mg/dL   Alkaline Phosphatase 74 39 - 117 U/L   AST 15 0 - 37 U/L   ALT 13 0 - 35 U/L   Total Protein 6.9 6.0 - 8.3 g/dL   Albumin 4.7 3.5 - 5.2 g/dL   GFR 89.48 >60.00 mL/min   Calcium 9.8 8.4 - 10.5 mg/dL  Lipid panel  Result Value Ref Range   Cholesterol 221 (H) 0 - 200 mg/dL   Triglycerides 99.0 0.0 - 149.0 mg/dL   HDL 65.90 >39.00 mg/dL   VLDL 19.8 0.0 - 40.0 mg/dL   LDL Cholesterol 135 (H) 0 - 99 mg/dL   Total CHOL/HDL Ratio 3    NonHDL 154.94      COVID 19 screen:  No recent travel or known exposure to COVID19 The patient denies respiratory symptoms of COVID 19 at this time. The importance of social distancing was discussed today.   Assessment and Plan The patient's preventative maintenance and recommended screening tests for an annual wellness exam were reviewed in full today. Brought up to date unless services declined.  Counselled on the importance of diet, exercise, and its role in overall health and mortality. The patient's FH and SH was reviewed, including their home life, tobacco status, and drug and alcohol status.   Vaccines: Tdap uptodate, COVID x 3, consider shingrix PAP/DVE:  Per GYN, 3/8/202 need repeat given scant cells. Mammo:  per GYN 05/09/2020... just had at [facility] Colon:  2019 Smoking  Status:smoker 1/2 pack per day.. Considering chantix again.. had HA and  nightmares with Chantix in past, SE  of hive to wellbutrin. Spent 5 minutes discussing cessation techniques, plan 10 year ETOH/ drug EUM:PNTI/RWER  Hep C:due   Problem List Items Addressed This Visit     Tobacco abuse    Smoking cessation instruction/counseling given:  counseled patient on the dangers of tobacco use, advised patient to stop smoking, and reviewed strategies to maximize success       Other Visit Diagnoses     Routine general medical examination at a health care facility    -  Primary   Need for shingles vaccine       Relevant Orders   Zoster Recombinant (Shingrix ) (Completed)      Meds ordered this encounter  Medications   varenicline (CHANTIX) 0.5 MG tablet    Sig: Take 1 tablet (0.5 mg total) by mouth 2 (two) times daily.    Dispense:  60 tablet    Refill:  5   Orders Placed This Encounter  Procedures   [facility] MAMMOGRAPHY    This external order was created through the Results Console.   Zoster Recombinant (Shingrix )   [facility] PAP SMEAR    This  external order was created through the Results Console.    Eliezer Lofts, MD

## 2021-09-11 NOTE — Patient Instructions (Addendum)
Return in 2-6 month for nurse visit for second Shingrix vaccine.  Quit smoking.  Try trial of low dose chantix.  Start home physical therapy on feet  for toe pain. Avoid flip flops or shoes that compress dorsal foot.

## 2021-09-21 NOTE — Assessment & Plan Note (Signed)
Smoking cessation instruction/counseling given:  counseled patient on the dangers of tobacco use, advised patient to stop smoking, and reviewed strategies to maximize success 

## 2021-10-26 DIAGNOSIS — J0100 Acute maxillary sinusitis, unspecified: Secondary | ICD-10-CM | POA: Diagnosis not present

## 2022-03-16 ENCOUNTER — Other Ambulatory Visit: Payer: Self-pay | Admitting: Family Medicine

## 2022-03-16 NOTE — Telephone Encounter (Signed)
Last office visit 09/11/21 for CPE.  Last refilled 09/11/21 for #60 with 5 refills.  No future appointments with PCP.

## 2022-03-21 DIAGNOSIS — S52571A Other intraarticular fracture of lower end of right radius, initial encounter for closed fracture: Secondary | ICD-10-CM | POA: Diagnosis not present

## 2022-03-21 DIAGNOSIS — S52601A Unspecified fracture of lower end of right ulna, initial encounter for closed fracture: Secondary | ICD-10-CM | POA: Diagnosis not present

## 2022-03-26 DIAGNOSIS — M25531 Pain in right wrist: Secondary | ICD-10-CM | POA: Diagnosis not present

## 2022-03-26 DIAGNOSIS — S52571A Other intraarticular fracture of lower end of right radius, initial encounter for closed fracture: Secondary | ICD-10-CM | POA: Diagnosis not present

## 2022-03-27 DIAGNOSIS — Y999 Unspecified external cause status: Secondary | ICD-10-CM | POA: Diagnosis not present

## 2022-03-27 DIAGNOSIS — X58XXXA Exposure to other specified factors, initial encounter: Secondary | ICD-10-CM | POA: Diagnosis not present

## 2022-03-27 DIAGNOSIS — S52571A Other intraarticular fracture of lower end of right radius, initial encounter for closed fracture: Secondary | ICD-10-CM | POA: Diagnosis not present

## 2022-04-06 DIAGNOSIS — S52571A Other intraarticular fracture of lower end of right radius, initial encounter for closed fracture: Secondary | ICD-10-CM | POA: Diagnosis not present

## 2022-04-28 DIAGNOSIS — S52571A Other intraarticular fracture of lower end of right radius, initial encounter for closed fracture: Secondary | ICD-10-CM | POA: Diagnosis not present

## 2022-04-30 DIAGNOSIS — Z789 Other specified health status: Secondary | ICD-10-CM | POA: Diagnosis not present

## 2022-04-30 DIAGNOSIS — S52571A Other intraarticular fracture of lower end of right radius, initial encounter for closed fracture: Secondary | ICD-10-CM | POA: Diagnosis not present

## 2022-04-30 DIAGNOSIS — M25631 Stiffness of right wrist, not elsewhere classified: Secondary | ICD-10-CM | POA: Diagnosis not present

## 2022-04-30 DIAGNOSIS — M25531 Pain in right wrist: Secondary | ICD-10-CM | POA: Diagnosis not present

## 2022-05-05 DIAGNOSIS — M25531 Pain in right wrist: Secondary | ICD-10-CM | POA: Diagnosis not present

## 2022-05-05 DIAGNOSIS — S52571A Other intraarticular fracture of lower end of right radius, initial encounter for closed fracture: Secondary | ICD-10-CM | POA: Diagnosis not present

## 2022-05-05 DIAGNOSIS — M25631 Stiffness of right wrist, not elsewhere classified: Secondary | ICD-10-CM | POA: Diagnosis not present

## 2022-05-05 DIAGNOSIS — Z789 Other specified health status: Secondary | ICD-10-CM | POA: Diagnosis not present

## 2022-05-12 DIAGNOSIS — Z789 Other specified health status: Secondary | ICD-10-CM | POA: Diagnosis not present

## 2022-05-12 DIAGNOSIS — M25631 Stiffness of right wrist, not elsewhere classified: Secondary | ICD-10-CM | POA: Diagnosis not present

## 2022-05-12 DIAGNOSIS — M25531 Pain in right wrist: Secondary | ICD-10-CM | POA: Diagnosis not present

## 2022-05-12 DIAGNOSIS — S52571A Other intraarticular fracture of lower end of right radius, initial encounter for closed fracture: Secondary | ICD-10-CM | POA: Diagnosis not present

## 2022-05-15 DIAGNOSIS — S52571A Other intraarticular fracture of lower end of right radius, initial encounter for closed fracture: Secondary | ICD-10-CM | POA: Diagnosis not present

## 2022-05-15 DIAGNOSIS — M25631 Stiffness of right wrist, not elsewhere classified: Secondary | ICD-10-CM | POA: Diagnosis not present

## 2022-05-15 DIAGNOSIS — Z789 Other specified health status: Secondary | ICD-10-CM | POA: Diagnosis not present

## 2022-05-15 DIAGNOSIS — M25531 Pain in right wrist: Secondary | ICD-10-CM | POA: Diagnosis not present

## 2022-05-18 DIAGNOSIS — S52571A Other intraarticular fracture of lower end of right radius, initial encounter for closed fracture: Secondary | ICD-10-CM | POA: Diagnosis not present

## 2022-05-19 DIAGNOSIS — M25631 Stiffness of right wrist, not elsewhere classified: Secondary | ICD-10-CM | POA: Diagnosis not present

## 2022-05-19 DIAGNOSIS — S52571A Other intraarticular fracture of lower end of right radius, initial encounter for closed fracture: Secondary | ICD-10-CM | POA: Diagnosis not present

## 2022-05-19 DIAGNOSIS — Z789 Other specified health status: Secondary | ICD-10-CM | POA: Diagnosis not present

## 2022-05-19 DIAGNOSIS — M25531 Pain in right wrist: Secondary | ICD-10-CM | POA: Diagnosis not present

## 2022-05-20 ENCOUNTER — Other Ambulatory Visit: Payer: Self-pay | Admitting: Family Medicine

## 2022-05-22 DIAGNOSIS — Z789 Other specified health status: Secondary | ICD-10-CM | POA: Diagnosis not present

## 2022-05-22 DIAGNOSIS — M25531 Pain in right wrist: Secondary | ICD-10-CM | POA: Diagnosis not present

## 2022-05-22 DIAGNOSIS — M25631 Stiffness of right wrist, not elsewhere classified: Secondary | ICD-10-CM | POA: Diagnosis not present

## 2022-05-22 DIAGNOSIS — S52571A Other intraarticular fracture of lower end of right radius, initial encounter for closed fracture: Secondary | ICD-10-CM | POA: Diagnosis not present

## 2022-05-26 DIAGNOSIS — M25631 Stiffness of right wrist, not elsewhere classified: Secondary | ICD-10-CM | POA: Diagnosis not present

## 2022-05-26 DIAGNOSIS — Z789 Other specified health status: Secondary | ICD-10-CM | POA: Diagnosis not present

## 2022-05-26 DIAGNOSIS — S52571A Other intraarticular fracture of lower end of right radius, initial encounter for closed fracture: Secondary | ICD-10-CM | POA: Diagnosis not present

## 2022-05-26 DIAGNOSIS — M25531 Pain in right wrist: Secondary | ICD-10-CM | POA: Diagnosis not present

## 2022-05-28 DIAGNOSIS — Z789 Other specified health status: Secondary | ICD-10-CM | POA: Diagnosis not present

## 2022-05-28 DIAGNOSIS — M25531 Pain in right wrist: Secondary | ICD-10-CM | POA: Diagnosis not present

## 2022-05-28 DIAGNOSIS — M25631 Stiffness of right wrist, not elsewhere classified: Secondary | ICD-10-CM | POA: Diagnosis not present

## 2022-05-28 DIAGNOSIS — S52571A Other intraarticular fracture of lower end of right radius, initial encounter for closed fracture: Secondary | ICD-10-CM | POA: Diagnosis not present

## 2022-06-02 DIAGNOSIS — Z789 Other specified health status: Secondary | ICD-10-CM | POA: Diagnosis not present

## 2022-06-02 DIAGNOSIS — M25531 Pain in right wrist: Secondary | ICD-10-CM | POA: Diagnosis not present

## 2022-06-02 DIAGNOSIS — S52571A Other intraarticular fracture of lower end of right radius, initial encounter for closed fracture: Secondary | ICD-10-CM | POA: Diagnosis not present

## 2022-06-02 DIAGNOSIS — M25631 Stiffness of right wrist, not elsewhere classified: Secondary | ICD-10-CM | POA: Diagnosis not present

## 2022-06-04 DIAGNOSIS — M25631 Stiffness of right wrist, not elsewhere classified: Secondary | ICD-10-CM | POA: Diagnosis not present

## 2022-06-04 DIAGNOSIS — S52571A Other intraarticular fracture of lower end of right radius, initial encounter for closed fracture: Secondary | ICD-10-CM | POA: Diagnosis not present

## 2022-06-04 DIAGNOSIS — M25531 Pain in right wrist: Secondary | ICD-10-CM | POA: Diagnosis not present

## 2022-06-04 DIAGNOSIS — Z789 Other specified health status: Secondary | ICD-10-CM | POA: Diagnosis not present

## 2022-06-08 DIAGNOSIS — M25531 Pain in right wrist: Secondary | ICD-10-CM | POA: Diagnosis not present

## 2022-06-08 DIAGNOSIS — M25631 Stiffness of right wrist, not elsewhere classified: Secondary | ICD-10-CM | POA: Diagnosis not present

## 2022-06-08 DIAGNOSIS — S52571A Other intraarticular fracture of lower end of right radius, initial encounter for closed fracture: Secondary | ICD-10-CM | POA: Diagnosis not present

## 2022-06-08 DIAGNOSIS — Z789 Other specified health status: Secondary | ICD-10-CM | POA: Diagnosis not present

## 2022-06-11 DIAGNOSIS — M25631 Stiffness of right wrist, not elsewhere classified: Secondary | ICD-10-CM | POA: Diagnosis not present

## 2022-06-11 DIAGNOSIS — Z789 Other specified health status: Secondary | ICD-10-CM | POA: Diagnosis not present

## 2022-06-11 DIAGNOSIS — M25531 Pain in right wrist: Secondary | ICD-10-CM | POA: Diagnosis not present

## 2022-06-17 DIAGNOSIS — M25531 Pain in right wrist: Secondary | ICD-10-CM | POA: Diagnosis not present

## 2022-06-17 DIAGNOSIS — S52571A Other intraarticular fracture of lower end of right radius, initial encounter for closed fracture: Secondary | ICD-10-CM | POA: Diagnosis not present

## 2022-06-17 DIAGNOSIS — Z789 Other specified health status: Secondary | ICD-10-CM | POA: Diagnosis not present

## 2022-06-17 DIAGNOSIS — M25631 Stiffness of right wrist, not elsewhere classified: Secondary | ICD-10-CM | POA: Diagnosis not present

## 2022-06-19 DIAGNOSIS — Z789 Other specified health status: Secondary | ICD-10-CM | POA: Diagnosis not present

## 2022-06-19 DIAGNOSIS — M25631 Stiffness of right wrist, not elsewhere classified: Secondary | ICD-10-CM | POA: Diagnosis not present

## 2022-06-19 DIAGNOSIS — M25531 Pain in right wrist: Secondary | ICD-10-CM | POA: Diagnosis not present

## 2022-06-19 DIAGNOSIS — S52571A Other intraarticular fracture of lower end of right radius, initial encounter for closed fracture: Secondary | ICD-10-CM | POA: Diagnosis not present

## 2022-06-22 DIAGNOSIS — M25631 Stiffness of right wrist, not elsewhere classified: Secondary | ICD-10-CM | POA: Diagnosis not present

## 2022-06-22 DIAGNOSIS — M25531 Pain in right wrist: Secondary | ICD-10-CM | POA: Diagnosis not present

## 2022-06-22 DIAGNOSIS — S52571A Other intraarticular fracture of lower end of right radius, initial encounter for closed fracture: Secondary | ICD-10-CM | POA: Diagnosis not present

## 2022-06-22 DIAGNOSIS — Z789 Other specified health status: Secondary | ICD-10-CM | POA: Diagnosis not present

## 2022-06-24 DIAGNOSIS — M25531 Pain in right wrist: Secondary | ICD-10-CM | POA: Diagnosis not present

## 2022-06-24 DIAGNOSIS — Z789 Other specified health status: Secondary | ICD-10-CM | POA: Diagnosis not present

## 2022-06-24 DIAGNOSIS — M25631 Stiffness of right wrist, not elsewhere classified: Secondary | ICD-10-CM | POA: Diagnosis not present

## 2022-06-24 DIAGNOSIS — S52571A Other intraarticular fracture of lower end of right radius, initial encounter for closed fracture: Secondary | ICD-10-CM | POA: Diagnosis not present

## 2022-07-01 DIAGNOSIS — S52571A Other intraarticular fracture of lower end of right radius, initial encounter for closed fracture: Secondary | ICD-10-CM | POA: Diagnosis not present

## 2022-07-02 DIAGNOSIS — S52571A Other intraarticular fracture of lower end of right radius, initial encounter for closed fracture: Secondary | ICD-10-CM | POA: Diagnosis not present

## 2022-07-02 DIAGNOSIS — M25631 Stiffness of right wrist, not elsewhere classified: Secondary | ICD-10-CM | POA: Diagnosis not present

## 2022-07-02 DIAGNOSIS — M25531 Pain in right wrist: Secondary | ICD-10-CM | POA: Diagnosis not present

## 2022-07-02 DIAGNOSIS — Z789 Other specified health status: Secondary | ICD-10-CM | POA: Diagnosis not present

## 2022-07-07 DIAGNOSIS — S52571A Other intraarticular fracture of lower end of right radius, initial encounter for closed fracture: Secondary | ICD-10-CM | POA: Diagnosis not present

## 2022-07-07 DIAGNOSIS — M25631 Stiffness of right wrist, not elsewhere classified: Secondary | ICD-10-CM | POA: Diagnosis not present

## 2022-07-07 DIAGNOSIS — M25531 Pain in right wrist: Secondary | ICD-10-CM | POA: Diagnosis not present

## 2022-07-07 DIAGNOSIS — Z789 Other specified health status: Secondary | ICD-10-CM | POA: Diagnosis not present

## 2022-07-08 ENCOUNTER — Other Ambulatory Visit: Payer: Self-pay | Admitting: Family Medicine

## 2022-07-13 DIAGNOSIS — M25531 Pain in right wrist: Secondary | ICD-10-CM | POA: Diagnosis not present

## 2022-07-13 DIAGNOSIS — Z789 Other specified health status: Secondary | ICD-10-CM | POA: Diagnosis not present

## 2022-07-13 DIAGNOSIS — S52571A Other intraarticular fracture of lower end of right radius, initial encounter for closed fracture: Secondary | ICD-10-CM | POA: Diagnosis not present

## 2022-07-13 DIAGNOSIS — M25631 Stiffness of right wrist, not elsewhere classified: Secondary | ICD-10-CM | POA: Diagnosis not present

## 2022-07-15 DIAGNOSIS — Z789 Other specified health status: Secondary | ICD-10-CM | POA: Diagnosis not present

## 2022-07-15 DIAGNOSIS — M25531 Pain in right wrist: Secondary | ICD-10-CM | POA: Diagnosis not present

## 2022-07-15 DIAGNOSIS — M25631 Stiffness of right wrist, not elsewhere classified: Secondary | ICD-10-CM | POA: Diagnosis not present

## 2022-07-15 DIAGNOSIS — S52571A Other intraarticular fracture of lower end of right radius, initial encounter for closed fracture: Secondary | ICD-10-CM | POA: Diagnosis not present

## 2022-07-22 DIAGNOSIS — M25631 Stiffness of right wrist, not elsewhere classified: Secondary | ICD-10-CM | POA: Diagnosis not present

## 2022-07-22 DIAGNOSIS — M25531 Pain in right wrist: Secondary | ICD-10-CM | POA: Diagnosis not present

## 2022-07-22 DIAGNOSIS — Z789 Other specified health status: Secondary | ICD-10-CM | POA: Diagnosis not present

## 2022-07-22 DIAGNOSIS — S52571A Other intraarticular fracture of lower end of right radius, initial encounter for closed fracture: Secondary | ICD-10-CM | POA: Diagnosis not present

## 2022-07-24 DIAGNOSIS — M25631 Stiffness of right wrist, not elsewhere classified: Secondary | ICD-10-CM | POA: Diagnosis not present

## 2022-07-24 DIAGNOSIS — M25431 Effusion, right wrist: Secondary | ICD-10-CM | POA: Diagnosis not present

## 2022-07-24 DIAGNOSIS — M25531 Pain in right wrist: Secondary | ICD-10-CM | POA: Diagnosis not present

## 2022-07-24 DIAGNOSIS — S52571A Other intraarticular fracture of lower end of right radius, initial encounter for closed fracture: Secondary | ICD-10-CM | POA: Diagnosis not present

## 2022-07-27 DIAGNOSIS — M25531 Pain in right wrist: Secondary | ICD-10-CM | POA: Diagnosis not present

## 2022-07-29 DIAGNOSIS — M25631 Stiffness of right wrist, not elsewhere classified: Secondary | ICD-10-CM | POA: Diagnosis not present

## 2022-07-29 DIAGNOSIS — S52571A Other intraarticular fracture of lower end of right radius, initial encounter for closed fracture: Secondary | ICD-10-CM | POA: Diagnosis not present

## 2022-07-29 DIAGNOSIS — Z789 Other specified health status: Secondary | ICD-10-CM | POA: Diagnosis not present

## 2022-07-29 DIAGNOSIS — M25531 Pain in right wrist: Secondary | ICD-10-CM | POA: Diagnosis not present

## 2022-07-30 DIAGNOSIS — Z1231 Encounter for screening mammogram for malignant neoplasm of breast: Secondary | ICD-10-CM | POA: Diagnosis not present

## 2022-07-30 DIAGNOSIS — Z6826 Body mass index (BMI) 26.0-26.9, adult: Secondary | ICD-10-CM | POA: Diagnosis not present

## 2022-07-30 DIAGNOSIS — Z01419 Encounter for gynecological examination (general) (routine) without abnormal findings: Secondary | ICD-10-CM | POA: Diagnosis not present

## 2022-08-04 DIAGNOSIS — M65312 Trigger thumb, left thumb: Secondary | ICD-10-CM | POA: Diagnosis not present

## 2022-08-04 DIAGNOSIS — S52571A Other intraarticular fracture of lower end of right radius, initial encounter for closed fracture: Secondary | ICD-10-CM | POA: Diagnosis not present

## 2022-08-06 DIAGNOSIS — M25531 Pain in right wrist: Secondary | ICD-10-CM | POA: Diagnosis not present

## 2022-08-06 DIAGNOSIS — S52571A Other intraarticular fracture of lower end of right radius, initial encounter for closed fracture: Secondary | ICD-10-CM | POA: Diagnosis not present

## 2022-08-06 DIAGNOSIS — M25631 Stiffness of right wrist, not elsewhere classified: Secondary | ICD-10-CM | POA: Diagnosis not present

## 2022-08-06 DIAGNOSIS — Z789 Other specified health status: Secondary | ICD-10-CM | POA: Diagnosis not present

## 2022-08-18 DIAGNOSIS — M25531 Pain in right wrist: Secondary | ICD-10-CM | POA: Diagnosis not present

## 2022-08-18 DIAGNOSIS — S52571A Other intraarticular fracture of lower end of right radius, initial encounter for closed fracture: Secondary | ICD-10-CM | POA: Diagnosis not present

## 2022-08-18 DIAGNOSIS — M25631 Stiffness of right wrist, not elsewhere classified: Secondary | ICD-10-CM | POA: Diagnosis not present

## 2022-08-18 DIAGNOSIS — Z789 Other specified health status: Secondary | ICD-10-CM | POA: Diagnosis not present

## 2022-09-01 DIAGNOSIS — M25531 Pain in right wrist: Secondary | ICD-10-CM | POA: Diagnosis not present

## 2022-09-01 DIAGNOSIS — M65312 Trigger thumb, left thumb: Secondary | ICD-10-CM | POA: Diagnosis not present

## 2022-09-09 ENCOUNTER — Encounter (INDEPENDENT_AMBULATORY_CARE_PROVIDER_SITE_OTHER): Payer: Self-pay

## 2022-09-14 ENCOUNTER — Telehealth: Payer: Self-pay | Admitting: *Deleted

## 2022-09-14 DIAGNOSIS — Z13220 Encounter for screening for lipoid disorders: Secondary | ICD-10-CM

## 2022-09-14 NOTE — Telephone Encounter (Signed)
-----   Message from Alvina Chou sent at 09/11/2022  3:39 PM EDT ----- Regarding: Lab orders for Thursday, 8.15.24 Patient is scheduled for CPX labs, please order future labs, Thanks , Camelia Eng

## 2022-09-24 ENCOUNTER — Other Ambulatory Visit (INDEPENDENT_AMBULATORY_CARE_PROVIDER_SITE_OTHER): Payer: BC Managed Care – PPO

## 2022-09-24 DIAGNOSIS — Z13220 Encounter for screening for lipoid disorders: Secondary | ICD-10-CM | POA: Diagnosis not present

## 2022-09-24 LAB — COMPREHENSIVE METABOLIC PANEL
ALT: 13 U/L (ref 0–35)
AST: 15 U/L (ref 0–37)
Albumin: 4.7 g/dL (ref 3.5–5.2)
Alkaline Phosphatase: 68 U/L (ref 39–117)
BUN: 10 mg/dL (ref 6–23)
CO2: 27 mEq/L (ref 19–32)
Calcium: 10 mg/dL (ref 8.4–10.5)
Chloride: 103 mEq/L (ref 96–112)
Creatinine, Ser: 0.78 mg/dL (ref 0.40–1.20)
GFR: 84.74 mL/min (ref >60.00)
Glucose, Bld: 86 mg/dL (ref 70–99)
Potassium: 4.2 mEq/L (ref 3.5–5.1)
Sodium: 138 mEq/L (ref 135–145)
Total Bilirubin: 0.7 mg/dL (ref 0.2–1.2)
Total Protein: 7.4 g/dL (ref 6.0–8.3)

## 2022-09-24 LAB — LIPID PANEL
Cholesterol: 215 mg/dL — ABNORMAL HIGH (ref 0–200)
HDL: 67 mg/dL (ref >39.00)
LDL Cholesterol: 132 mg/dL — ABNORMAL HIGH (ref 0–99)
NonHDL: 147.85
Total CHOL/HDL Ratio: 3
Triglycerides: 78 mg/dL (ref 0.0–149.0)
VLDL: 15.6 mg/dL (ref 0.0–40.0)

## 2022-09-24 NOTE — Progress Notes (Signed)
No critical labs need to be addressed urgently. We will discuss labs in detail at upcoming office visit.   

## 2022-09-29 ENCOUNTER — Encounter: Payer: Self-pay | Admitting: Family Medicine

## 2022-09-29 ENCOUNTER — Ambulatory Visit (INDEPENDENT_AMBULATORY_CARE_PROVIDER_SITE_OTHER): Payer: BC Managed Care – PPO | Admitting: Family Medicine

## 2022-09-29 VITALS — BP 116/66 | HR 89 | Temp 98.5°F | Ht 63.0 in | Wt 154.1 lb

## 2022-09-29 DIAGNOSIS — Z0000 Encounter for general adult medical examination without abnormal findings: Secondary | ICD-10-CM | POA: Diagnosis not present

## 2022-09-29 DIAGNOSIS — Z131 Encounter for screening for diabetes mellitus: Secondary | ICD-10-CM

## 2022-09-29 DIAGNOSIS — Z720 Tobacco use: Secondary | ICD-10-CM

## 2022-09-29 DIAGNOSIS — R7303 Prediabetes: Secondary | ICD-10-CM | POA: Insufficient documentation

## 2022-09-29 LAB — POCT GLYCOSYLATED HEMOGLOBIN (HGB A1C): Hemoglobin A1C: 6 % — AB (ref 4.0–5.6)

## 2022-09-29 NOTE — Assessment & Plan Note (Addendum)
Smoking cessation instruction/counseling given:  counseled patient on the dangers of tobacco use, advised patient to stop smoking, and reviewed strategies to maximize success  She plans to restart Chantix this winter.  She has prescription from last year.

## 2022-09-29 NOTE — Assessment & Plan Note (Signed)
Encouraged exercise, weight loss, healthy eating habits. ? ?

## 2022-09-29 NOTE — Progress Notes (Signed)
Patient ID: Anita White, female    DOB: 1966-01-02, 57 y.o.   MRN: [id number]  This visit was conducted in person.  BP 116/66 (BP Location: Left Arm, Patient Position: Sitting, Cuff Size: Normal)   Pulse 89   Temp 98.5 F (36.9 C) (Temporal)   Ht 5\' 3"  (1.6 m)   Wt 154 lb 2 oz (69.9 kg)   SpO2 99%   BMI 27.30 kg/m    CC:  Chief Complaint  Patient presents with   Annual Exam    Subjective:   HPI: Anita White is a 57 y.o. female presenting on 09/29/2022 for Annual Exam  The patient presents for  complete physical and review of chronic health problems. He/She also has the following acute concerns today:  Reviewed labs in detail. Lab Results  Component Value Date   CHOL 215 (H) 09/24/2022   HDL 67.00 09/24/2022   LDLCALC 132 (H) 09/24/2022   TRIG 78.0 09/24/2022   CHOLHDL 3 09/24/2022   The 10-year ASCVD risk score (Arnett DK, et al., 2019) is: 3.9%   Values used to calculate the score:     Age: 20 years     Sex: Female     Is Non-Hispanic African American: No     Diabetic: No     Tobacco smoker: Yes     Systolic Blood Pressure: 116 mmHg     Is BP treated: No     HDL Cholesterol: 67 mg/dL     Total Cholesterol: 215 mg/dL   She has gained some weight back since broke wrist... had been less active.  She completed PT.. still decreased ROM in right wrist.  Wt Readings from Last 3 Encounters:  09/29/22 154 lb 2 oz (69.9 kg)  09/11/21 141 lb 6 oz (64.1 kg)  08/13/20 157 lb 12 oz (71.6 kg)   She has been working on low carb, lots of water, healthy diet.  She is now back to walking.  Relevant past medical, surgical, family and social history reviewed and updated as indicated. Interim medical history since our last visit reviewed. Allergies and medications reviewed and updated. Outpatient Medications Prior to Visit  Medication Sig Dispense Refill   albuterol (VENTOLIN HFA) 108 (90 Base) MCG/ACT inhaler INHALE 2 PUFFS BY MOUTH EVERY 6 HOURS AS NEEDED FOR  WHEEZE OR SHORTNESS OF BREATH 6.7 each 2   aspirin EC 81 MG tablet Take 81 mg by mouth at bedtime.      azelastine (ASTELIN) 0.1 % nasal spray Place 2 sprays into both nostrils 2 (two) times daily as needed.     cetirizine (ZYRTEC) 10 MG tablet Take 10 mg by mouth daily as needed (for seasonal allergies).      Cholecalciferol (VITAMIN D-3) 25 MCG (1000 UT) CAPS Take 1,000 Units by mouth daily.     clotrimazole-betamethasone (LOTRISONE) cream Apply 1 application topically 2 (two) times daily. 30 g 0   esomeprazole (NEXIUM) 40 MG capsule Take 40 mg by mouth daily.     fluticasone (FLONASE) 50 MCG/ACT nasal spray Place 2 sprays into both nostrils daily as needed (for seasonal allergies).      gabapentin (NEURONTIN) 300 MG capsule Take 300 mg by mouth 3 (three) times daily.     Omega-3 Fatty Acids (OMEGA-3 FISH OIL PO) Take 1 capsule by mouth daily with breakfast.     rOPINIRole (REQUIP) 0.25 MG tablet TAKE 1 TABLET BY MOUTH EVERYDAY AT BEDTIME 90 tablet 0   vitamin C (ASCORBIC ACID)  500 MG tablet Take 500 mg by mouth daily.     varenicline (CHANTIX) 0.5 MG tablet TAKE 1 TABLET BY MOUTH 2 TIMES DAILY. (Patient not taking: Reported on 09/29/2022) 180 tablet 1   VITAMIN E PO Take 1 capsule by mouth daily.     No facility-administered medications prior to visit.     Per HPI unless specifically indicated in ROS section below Review of Systems  Constitutional:  Negative for fatigue and fever.  HENT:  Negative for congestion.   Eyes:  Negative for pain.  Respiratory:  Negative for cough and shortness of breath.   Cardiovascular:  Negative for chest pain, palpitations and leg swelling.  Gastrointestinal:  Negative for abdominal pain.  Genitourinary:  Negative for dysuria and vaginal bleeding.  Musculoskeletal:  Negative for back pain.  Neurological:  Negative for syncope, light-headedness and headaches.  Psychiatric/Behavioral:  Negative for dysphoric mood.    Objective:  BP 116/66 (BP Location:  Left Arm, Patient Position: Sitting, Cuff Size: Normal)   Pulse 89   Temp 98.5 F (36.9 C) (Temporal)   Ht 5\' 3"  (1.6 m)   Wt 154 lb 2 oz (69.9 kg)   SpO2 99%   BMI 27.30 kg/m   Wt Readings from Last 3 Encounters:  09/29/22 154 lb 2 oz (69.9 kg)  09/11/21 141 lb 6 oz (64.1 kg)  08/13/20 157 lb 12 oz (71.6 kg)      Physical Exam Vitals and nursing note reviewed.  Constitutional:      General: She is not in acute distress.    Appearance: Normal appearance. She is well-developed. She is not ill-appearing or toxic-appearing.  HENT:     Head: Normocephalic.     Right Ear: Hearing, tympanic membrane, ear canal and external ear normal.     Left Ear: Hearing, tympanic membrane, ear canal and external ear normal.     Nose: Nose normal.  Eyes:     General: Lids are normal. Lids are everted, no foreign bodies appreciated.     Conjunctiva/sclera: Conjunctivae normal.     Pupils: Pupils are equal, round, and reactive to light.  Neck:     Thyroid: No thyroid mass or thyromegaly.     Vascular: No carotid bruit.     Trachea: Trachea normal.  Cardiovascular:     Rate and Rhythm: Normal rate and regular rhythm.     Heart sounds: Normal heart sounds, S1 normal and S2 normal. No murmur heard.    No gallop.  Pulmonary:     Effort: Pulmonary effort is normal. No respiratory distress.     Breath sounds: Normal breath sounds. No wheezing, rhonchi or rales.  Abdominal:     General: Bowel sounds are normal. There is no distension or abdominal bruit.     Palpations: Abdomen is soft. There is no fluid wave or mass.     Tenderness: There is no abdominal tenderness. There is no guarding or rebound.     Hernia: No hernia is present.  Musculoskeletal:     Cervical back: Normal range of motion and neck supple.  Lymphadenopathy:     Cervical: No cervical adenopathy.  Skin:    General: Skin is warm and dry.     Findings: No rash.  Neurological:     Mental Status: She is alert.     Cranial Nerves:  No cranial nerve deficit.     Sensory: No sensory deficit.  Psychiatric:        Mood and Affect: Mood is  not anxious or depressed.        Speech: Speech normal.        Behavior: Behavior normal. Behavior is cooperative.        Judgment: Judgment normal.       Results for orders placed or performed in visit on 09/29/22  POCT glycosylated hemoglobin (Hb A1C)  Result Value Ref Range   Hemoglobin A1C 6.0 (A) 4.0 - 5.6 %   HbA1c POC (<> result, manual entry)     HbA1c, POC (prediabetic range)     HbA1c, POC (controlled diabetic range)       COVID 19 screen:  No recent travel or known exposure to COVID19 The patient denies respiratory symptoms of COVID 19 at this time. The importance of social distancing was discussed today.   Assessment and Plan The patient's preventative maintenance and recommended screening tests for an annual wellness exam were reviewed in full today. Brought up to date unless services declined.  Counselled on the importance of diet, exercise, and its role in overall health and mortality. The patient's FH and SH was reviewed, including their home life, tobacco status, and drug and alcohol status.   Vaccines: Tdap uptodate, COVID x 3, consider shingrix and flu in fall PAP/DVE:  Per GYN, 07/2021   negative Mammo:  per GYN 07/29/2022... just had at [facility] Colon:  2019, repeat 10 year  Smoking Status: smoker 1/2 pack per day.. Chantix.. had HA and  nightmares with Chantix in past, SE  of hive to wellbutrin. Spent 5 minutes discussing cessation techniques, plan 10 year... willing to retry Chantix. ETOH/ drug ZOX:WRUE/AVWU  Hep C:due   Problem List Items Addressed This Visit     Prediabetes   Other Visit Diagnoses     Routine general medical examination at a health care facility    -  Primary   Screening for diabetes mellitus       Relevant Orders   POCT glycosylated hemoglobin (Hb A1C) (Completed)      No orders of the defined types were placed in  this encounter.  Orders Placed This Encounter  Procedures   POCT glycosylated hemoglobin (Hb A1C)    [name], MD

## 2022-09-29 NOTE — Patient Instructions (Addendum)
Work on low cholesterol low carb diet  and regular exercise.  Quit smoking.. restart Chantix as discussed.

## 2022-11-16 ENCOUNTER — Other Ambulatory Visit: Payer: Self-pay | Admitting: Family Medicine

## 2022-11-16 NOTE — Telephone Encounter (Signed)
LAST APPOINTMENT DATE: 09/29/2022 CPE    NEXT APPOINTMENT DATE: Visit date not found    LAST REFILL: 07/08/22  QTY: #90 no rf

## 2023-02-15 ENCOUNTER — Other Ambulatory Visit: Payer: Self-pay | Admitting: Family Medicine

## 2023-08-17 ENCOUNTER — Other Ambulatory Visit: Payer: Self-pay | Admitting: Family Medicine

## 2023-08-17 NOTE — Telephone Encounter (Signed)
 Please schedule CPE with fasting labs prior for Dr. Avelina after 09/29/2023.

## 2023-09-03 ENCOUNTER — Telehealth: Payer: Self-pay | Admitting: *Deleted

## 2023-09-03 DIAGNOSIS — Z13220 Encounter for screening for lipoid disorders: Secondary | ICD-10-CM

## 2023-09-03 NOTE — Telephone Encounter (Signed)
-----   Message from Veva JINNY Ferrari sent at 09/03/2023  3:15 PM EDT ----- Regarding: Lab orders for [facility], 8.15.25 Patient is scheduled for CPX labs, please order future labs, Thanks , Veva

## 2023-09-12 DIAGNOSIS — W228XXA Striking against or struck by other objects, initial encounter: Secondary | ICD-10-CM | POA: Diagnosis not present

## 2023-09-12 DIAGNOSIS — S0101XA Laceration without foreign body of scalp, initial encounter: Secondary | ICD-10-CM | POA: Diagnosis not present

## 2023-09-12 DIAGNOSIS — R519 Headache, unspecified: Secondary | ICD-10-CM | POA: Diagnosis not present

## 2023-09-15 ENCOUNTER — Telehealth: Payer: Self-pay | Admitting: Family Medicine

## 2023-09-15 NOTE — Telephone Encounter (Signed)
 Copied from CRM [id number]. Topic: Clinical - Medication Question >> Sep 15, 2023  9:35 AM Mia F wrote: Reason for CRM: Pt would like to know when she had her Tetanus shot done last. Please advise

## 2023-09-15 NOTE — Telephone Encounter (Signed)
 Spoke with Anita White and advised her that her last Tdap per our records was 11/12/2016. She had to have staples placed in the top of her head due to her hitting her head on a trash can while trying to clean it. While at the urgent care they asked her when her last Tdap was but she could not remember. Anita White has an appointment next week to come here to have the staples removed. She would like to know if Dr. Avelina recommends her updating her Tdap while she is here.  Dr. Avelina - please advise. Thank you!

## 2023-09-15 NOTE — Telephone Encounter (Signed)
 Awilda notified as instructed by telephone.  She can't recall any prior tetanus shots prior to 2018 so we will plan on doing a booster at there staple removal appointment.

## 2023-09-21 ENCOUNTER — Encounter: Payer: Self-pay | Admitting: Family Medicine

## 2023-09-21 ENCOUNTER — Ambulatory Visit: Payer: Self-pay | Admitting: Family Medicine

## 2023-09-21 VITALS — BP 140/72 | HR 76 | Temp 98.2°F | Ht 63.0 in | Wt 146.2 lb

## 2023-09-21 DIAGNOSIS — S0101XD Laceration without foreign body of scalp, subsequent encounter: Secondary | ICD-10-CM | POA: Diagnosis not present

## 2023-09-21 NOTE — Progress Notes (Signed)
 Patient ID: Anita White, female    DOB: [birth year], 58 y.o.   MRN: [id number]  This visit was conducted in person.  BP (!) 140/72   Pulse 76   Temp 98.2 F (36.8 C) (Temporal)   Ht 5' 3 (1.6 m)   Wt 146 lb 4 oz (66.3 kg)   SpO2 97%   BMI 25.91 kg/m    CC:  Chief Complaint  Patient presents with   Suture / Staple Removal    Subjective:   HPI: Anita White is a 57 y.o. female presenting on 09/21/2023 for Suture / Staple Removal   She reports she wash was cleaning a trash can, hit her in the head... laceration top head. Bruising on left face. Occurred on 09/11/2023  No LOC. Had 4 staples placced.  Head slightly sore.  No HA, no confusion, no neuro changes.        Relevant past medical, surgical, family and social history reviewed and updated as indicated. Interim medical history since our last visit reviewed. Allergies and medications reviewed and updated. Outpatient Medications Prior to Visit  Medication Sig Dispense Refill   albuterol  (VENTOLIN  HFA) 108 (90 Base) MCG/ACT inhaler INHALE 2 PUFFS BY MOUTH EVERY 6 HOURS AS NEEDED FOR WHEEZE OR SHORTNESS OF BREATH 6.7 each 2   aspirin EC 81 MG tablet Take 81 mg by mouth at bedtime.      azelastine (ASTELIN) 0.1 % nasal spray Place 2 sprays into both nostrils 2 (two) times daily as needed.     cetirizine (ZYRTEC) 10 MG tablet Take 10 mg by mouth daily as needed (for seasonal allergies).      Cholecalciferol (VITAMIN D-3) 25 MCG (1000 UT) CAPS Take 1,000 Units by mouth daily.     clotrimazole -betamethasone  (LOTRISONE ) cream Apply 1 application topically 2 (two) times daily. 30 g 0   esomeprazole (NEXIUM) 40 MG capsule Take 40 mg by mouth daily.     fluticasone  (FLONASE ) 50 MCG/ACT nasal spray Place 2 sprays into both nostrils daily as needed (for seasonal allergies).      gabapentin (NEURONTIN) 300 MG capsule Take 300 mg by mouth 3 (three) times daily.     mupirocin ointment (BACTROBAN) 2 % Apply 1 Application  topically 2 (two) times daily.     Omega-3 Fatty Acids (OMEGA-3 FISH OIL PO) Take 1 capsule by mouth daily with breakfast.     rOPINIRole  (REQUIP ) 0.25 MG tablet TAKE 1 TABLET BY MOUTH EVERYDAY AT BEDTIME 90 tablet 0   vitamin C (ASCORBIC ACID) 500 MG tablet Take 500 mg by mouth daily.     varenicline  (CHANTIX ) 0.5 MG tablet TAKE 1 TABLET BY MOUTH 2 TIMES DAILY. (Patient not taking: Reported on 09/29/2022) 180 tablet 1   No facility-administered medications prior to visit.     Per HPI unless specifically indicated in ROS section below Review of Systems  Constitutional:  Negative for fatigue and fever.  HENT:  Negative for ear pain.   Eyes:  Negative for pain.  Respiratory:  Negative for chest tightness and shortness of breath.   Cardiovascular:  Negative for chest pain, palpitations and leg swelling.  Gastrointestinal:  Negative for abdominal pain.  Genitourinary:  Negative for dysuria.   Objective:  BP (!) 140/72   Pulse 76   Temp 98.2 F (36.8 C) (Temporal)   Ht 5' 3 (1.6 m)   Wt 146 lb 4 oz (66.3 kg)   SpO2 97%   BMI 25.91 kg/m   Wt  Readings from Last 3 Encounters:  09/21/23 146 lb 4 oz (66.3 kg)  09/29/22 154 lb 2 oz (69.9 kg)  09/11/21 141 lb 6 oz (64.1 kg)      Physical Exam Constitutional:      General: She is not in acute distress.    Appearance: Normal appearance. She is well-developed. She is not ill-appearing or toxic-appearing.  HENT:     Head: Normocephalic. Laceration present.      Comments: Well-healing laceration across scalp anterior knee    Right Ear: Hearing, tympanic membrane, ear canal and external ear normal. Tympanic membrane is not erythematous, retracted or bulging.     Left Ear: Hearing, tympanic membrane, ear canal and external ear normal. Tympanic membrane is not erythematous, retracted or bulging.     Nose: No mucosal edema or rhinorrhea.     Right Sinus: No maxillary sinus tenderness or frontal sinus tenderness.     Left Sinus: No maxillary  sinus tenderness or frontal sinus tenderness.     Mouth/Throat:     Pharynx: Uvula midline.  Eyes:     General: Lids are normal. Lids are everted, no foreign bodies appreciated.     Conjunctiva/sclera: Conjunctivae normal.     Pupils: Pupils are equal, round, and reactive to light.  Neck:     Thyroid : No thyroid  mass or thyromegaly.     Vascular: No carotid bruit.     Trachea: Trachea normal.  Cardiovascular:     Rate and Rhythm: Normal rate and regular rhythm.     Pulses: Normal pulses.     Heart sounds: Normal heart sounds, S1 normal and S2 normal. No murmur heard.    No friction rub. No gallop.  Pulmonary:     Effort: Pulmonary effort is normal. No tachypnea or respiratory distress.     Breath sounds: Normal breath sounds. No decreased breath sounds, wheezing, rhonchi or rales.  Abdominal:     General: Bowel sounds are normal.     Palpations: Abdomen is soft.     Tenderness: There is no abdominal tenderness.  Musculoskeletal:     Cervical back: Normal range of motion and neck supple.  Skin:    General: Skin is warm and dry.     Findings: No rash.  Neurological:     Mental Status: She is alert.  Psychiatric:        Mood and Affect: Mood is not anxious or depressed.        Speech: Speech normal.        Behavior: Behavior normal. Behavior is cooperative.        Thought Content: Thought content normal.        Judgment: Judgment normal.       Results for orders placed or performed in visit on 09/29/22  POCT glycosylated hemoglobin (Hb A1C)   Collection Time: 09/29/22  3:21 PM  Result Value Ref Range   Hemoglobin A1C 6.0 (A) 4.0 - 5.6 %   HbA1c POC (<> result, manual entry)     HbA1c, POC (prediabetic range)     HbA1c, POC (controlled diabetic range)      Assessment and Plan  Scalp laceration, subsequent encounter Assessment & Plan: Area clean and dry with topical antibiotic ointment in place. 4 staples removed with staple removal without complications and minimal  pain, no bleeding. Wound edges well-approximated and healing well.  Recommended washing with warm soapy water gently.  Continue antibiotic ointment for the next several days.  No sign or symptom of  concussion, or neck injuries     No follow-ups on file.   Greig Ring, MD

## 2023-09-22 DIAGNOSIS — S0101XD Laceration without foreign body of scalp, subsequent encounter: Secondary | ICD-10-CM | POA: Insufficient documentation

## 2023-09-22 NOTE — Assessment & Plan Note (Signed)
 Area clean and dry with topical antibiotic ointment in place. 4 staples removed with staple removal without complications and minimal pain, no bleeding. Wound edges well-approximated and healing well.  Recommended washing with warm soapy water gently.  Continue antibiotic ointment for the next several days.  No sign or symptom of concussion, or neck injuries

## 2023-09-23 DIAGNOSIS — Z1231 Encounter for screening mammogram for malignant neoplasm of breast: Secondary | ICD-10-CM | POA: Diagnosis not present

## 2023-09-23 DIAGNOSIS — Z01419 Encounter for gynecological examination (general) (routine) without abnormal findings: Secondary | ICD-10-CM | POA: Diagnosis not present

## 2023-09-23 LAB — HM MAMMOGRAPHY

## 2023-09-24 ENCOUNTER — Ambulatory Visit: Payer: Self-pay | Admitting: Family Medicine

## 2023-09-24 ENCOUNTER — Other Ambulatory Visit (INDEPENDENT_AMBULATORY_CARE_PROVIDER_SITE_OTHER)

## 2023-09-24 DIAGNOSIS — Z13220 Encounter for screening for lipoid disorders: Secondary | ICD-10-CM | POA: Diagnosis not present

## 2023-09-24 LAB — COMPREHENSIVE METABOLIC PANEL WITH GFR
ALT: 12 U/L (ref 0–35)
AST: 16 U/L (ref 0–37)
Albumin: 4.6 g/dL (ref 3.5–5.2)
Alkaline Phosphatase: 76 U/L (ref 39–117)
BUN: 8 mg/dL (ref 6–23)
CO2: 28 meq/L (ref 19–32)
Calcium: 9.3 mg/dL (ref 8.4–10.5)
Chloride: 103 meq/L (ref 96–112)
Creatinine, Ser: 0.75 mg/dL (ref 0.40–1.20)
GFR: 88.2 mL/min (ref >60.00)
Glucose, Bld: 98 mg/dL (ref 70–99)
Potassium: 4.5 meq/L (ref 3.5–5.1)
Sodium: 139 meq/L (ref 135–145)
Total Bilirubin: 0.8 mg/dL (ref 0.2–1.2)
Total Protein: 7.2 g/dL (ref 6.0–8.3)

## 2023-09-24 LAB — LIPID PANEL
Cholesterol: 217 mg/dL — ABNORMAL HIGH (ref 0–200)
HDL: 57.9 mg/dL (ref >39.00)
LDL Cholesterol: 143 mg/dL — ABNORMAL HIGH (ref 0–99)
NonHDL: 158.93
Total CHOL/HDL Ratio: 4
Triglycerides: 80 mg/dL (ref 0.0–149.0)
VLDL: 16 mg/dL (ref 0.0–40.0)

## 2023-09-24 NOTE — Progress Notes (Signed)
 No critical labs need to be addressed urgently. We will discuss labs in detail at upcoming office visit.

## 2023-09-30 ENCOUNTER — Ambulatory Visit (INDEPENDENT_AMBULATORY_CARE_PROVIDER_SITE_OTHER): Admitting: Family Medicine

## 2023-09-30 ENCOUNTER — Encounter: Payer: Self-pay | Admitting: Family Medicine

## 2023-09-30 VITALS — BP 122/68 | HR 75 | Temp 98.1°F | Ht 62.5 in | Wt 143.2 lb

## 2023-09-30 DIAGNOSIS — R7303 Prediabetes: Secondary | ICD-10-CM

## 2023-09-30 DIAGNOSIS — Z23 Encounter for immunization: Secondary | ICD-10-CM | POA: Diagnosis not present

## 2023-09-30 DIAGNOSIS — Z720 Tobacco use: Secondary | ICD-10-CM

## 2023-09-30 DIAGNOSIS — Z0000 Encounter for general adult medical examination without abnormal findings: Secondary | ICD-10-CM | POA: Diagnosis not present

## 2023-09-30 MED ORDER — VARENICLINE TARTRATE 1 MG PO TABS: 1.0000 mg | ORAL_TABLET | Freq: Every day | ORAL | 3 refills | Status: DC

## 2023-09-30 NOTE — Assessment & Plan Note (Addendum)
 Chronic, greater than 25-pack-year history, current smoker.  Contemplative about cessation.  Did not tolerate Chantix  high dose.  Recommended trial of 1 mg or even 0.5 mg in the morning to avoid nighttime side effects.  Referral placed for lung cancer screening program.

## 2023-09-30 NOTE — Assessment & Plan Note (Signed)
 Encouraged exercise, weight loss, healthy eating habits. ? ?

## 2023-09-30 NOTE — Progress Notes (Signed)
 Drive safely   Patient ID: Anita White, female    DOB: 05-May-1965, 58 y.o.   MRN: [id number]  This visit was conducted in person.  BP 122/68   Pulse 75   Temp 98.1 F (36.7 C) (Oral)   Ht 5' 2.5 (1.588 m)   Wt 143 lb 4 oz (65 kg)   SpO2 98%   BMI 25.78 kg/m    CC:  Chief Complaint  Patient presents with   Annual Exam    Subjective:   HPI: Anita White is a 58 y.o. female presenting on 09/30/2023 for Annual Exam  The patient presents for  complete physical and review of chronic health problems. He/She also has the following acute concerns today: none  Reviewed labs in detail. Lab Results  Component Value Date   CHOL 217 (H) 09/24/2023   HDL 57.90 09/24/2023   LDLCALC 143 (H) 09/24/2023   TRIG 80.0 09/24/2023   CHOLHDL 4 09/24/2023   The 10-year ASCVD risk score (Arnett DK, et al., 2019) is: 5.2%   Values used to calculate the score:     Age: 66 years     Clincally relevant sex: Female     Is Non-Hispanic African American: No     Diabetic: No     Tobacco smoker: Yes     Systolic Blood Pressure: 122 mmHg     Is BP treated: No     HDL Cholesterol: 57.9 mg/dL     Total Cholesterol: 217 mg/dL   Glucose nml   She has lost weight Wt Readings from Last 3 Encounters:  09/30/23 143 lb 4 oz (65 kg)  09/21/23 146 lb 4 oz (66.3 kg)  09/29/22 154 lb 2 oz (69.9 kg)   She has been working on low carb, lots of water, healthy diet.  Stopped soda and drinking water.  She is walking  daily 6000 steps a day.  Relevant past medical, surgical, family and social history reviewed and updated as indicated. Interim medical history since our last visit reviewed. Allergies and medications reviewed and updated. Outpatient Medications Prior to Visit  Medication Sig Dispense Refill   albuterol  (VENTOLIN  HFA) 108 (90 Base) MCG/ACT inhaler INHALE 2 PUFFS BY MOUTH EVERY 6 HOURS AS NEEDED FOR WHEEZE OR SHORTNESS OF BREATH 6.7 each 2   aspirin EC 81 MG tablet Take 81 mg by mouth  at bedtime.      azelastine (ASTELIN) 0.1 % nasal spray Place 2 sprays into both nostrils 2 (two) times daily as needed.     cetirizine (ZYRTEC) 10 MG tablet Take 10 mg by mouth daily as needed (for seasonal allergies).      Cholecalciferol (VITAMIN D-3) 25 MCG (1000 UT) CAPS Take 1,000 Units by mouth daily.     clotrimazole -betamethasone  (LOTRISONE ) cream Apply 1 application topically 2 (two) times daily. 30 g 0   esomeprazole (NEXIUM) 40 MG capsule Take 40 mg by mouth daily.     fluticasone  (FLONASE ) 50 MCG/ACT nasal spray Place 2 sprays into both nostrils daily as needed (for seasonal allergies).      Omega-3 Fatty Acids (OMEGA-3 FISH OIL PO) Take 1 capsule by mouth daily with breakfast.     rOPINIRole  (REQUIP ) 0.25 MG tablet TAKE 1 TABLET BY MOUTH EVERYDAY AT BEDTIME 90 tablet 0   vitamin C (ASCORBIC ACID) 500 MG tablet Take 500 mg by mouth daily.     gabapentin (NEURONTIN) 300 MG capsule Take 300 mg by mouth 3 (three) times daily.  mupirocin ointment (BACTROBAN) 2 % Apply 1 Application topically 2 (two) times daily.     No facility-administered medications prior to visit.     Per HPI unless specifically indicated in ROS section below Review of Systems  Constitutional:  Negative for fatigue and fever.  HENT:  Negative for congestion.   Eyes:  Negative for pain.  Respiratory:  Negative for cough and shortness of breath.   Cardiovascular:  Negative for chest pain, palpitations and leg swelling.  Gastrointestinal:  Negative for abdominal pain.  Genitourinary:  Negative for dysuria and vaginal bleeding.  Musculoskeletal:  Negative for back pain.  Neurological:  Negative for syncope, light-headedness and headaches.  Psychiatric/Behavioral:  Negative for dysphoric mood.    Objective:  BP 122/68   Pulse 75   Temp 98.1 F (36.7 C) (Oral)   Ht 5' 2.5 (1.588 m)   Wt 143 lb 4 oz (65 kg)   SpO2 98%   BMI 25.78 kg/m   Wt Readings from Last 3 Encounters:  09/30/23 143 lb 4 oz (65  kg)  09/21/23 146 lb 4 oz (66.3 kg)  09/29/22 154 lb 2 oz (69.9 kg)      Physical Exam Vitals and nursing note reviewed.  Constitutional:      General: She is not in acute distress.    Appearance: Normal appearance. She is well-developed. She is not ill-appearing or toxic-appearing.  HENT:     Head: Normocephalic.     Right Ear: Hearing, tympanic membrane, ear canal and external ear normal.     Left Ear: Hearing, tympanic membrane, ear canal and external ear normal.     Nose: Nose normal.  Eyes:     General: Lids are normal. Lids are everted, no foreign bodies appreciated.     Conjunctiva/sclera: Conjunctivae normal.     Pupils: Pupils are equal, round, and reactive to light.  Neck:     Thyroid : No thyroid  mass or thyromegaly.     Vascular: No carotid bruit.     Trachea: Trachea normal.  Cardiovascular:     Rate and Rhythm: Normal rate and regular rhythm.     Heart sounds: Normal heart sounds, S1 normal and S2 normal. No murmur heard.    No gallop.  Pulmonary:     Effort: Pulmonary effort is normal. No respiratory distress.     Breath sounds: Normal breath sounds. No wheezing, rhonchi or rales.  Abdominal:     General: Bowel sounds are normal. There is no distension or abdominal bruit.     Palpations: Abdomen is soft. There is no fluid wave or mass.     Tenderness: There is no abdominal tenderness. There is no guarding or rebound.     Hernia: No hernia is present.  Musculoskeletal:     Cervical back: Normal range of motion and neck supple.  Lymphadenopathy:     Cervical: No cervical adenopathy.  Skin:    General: Skin is warm and dry.     Findings: No rash.  Neurological:     Mental Status: She is alert.     Cranial Nerves: No cranial nerve deficit.     Sensory: No sensory deficit.  Psychiatric:        Mood and Affect: Mood is not anxious or depressed.        Speech: Speech normal.        Behavior: Behavior normal. Behavior is cooperative.        Judgment: Judgment  normal.  Results for orders placed or performed in visit on 09/24/23  Lipid panel   Collection Time: 09/24/23  7:33 AM  Result Value Ref Range   Cholesterol 217 (H) 0 - 200 mg/dL   Triglycerides 19.9 0.0 - 149.0 mg/dL   HDL 42.09 >60.99 mg/dL   VLDL 83.9 0.0 - 59.9 mg/dL   LDL Cholesterol 856 (H) 0 - 99 mg/dL   Total CHOL/HDL Ratio 4    NonHDL 158.93   Comprehensive metabolic panel   Collection Time: 09/24/23  7:33 AM  Result Value Ref Range   Sodium 139 135 - 145 mEq/L   Potassium 4.5 3.5 - 5.1 mEq/L   Chloride 103 96 - 112 mEq/L   CO2 28 19 - 32 mEq/L   Glucose, Bld 98 70 - 99 mg/dL   BUN 8 6 - 23 mg/dL   Creatinine, Ser 9.24 0.40 - 1.20 mg/dL   Total Bilirubin 0.8 0.2 - 1.2 mg/dL   Alkaline Phosphatase 76 39 - 117 U/L   AST 16 0 - 37 U/L   ALT 12 0 - 35 U/L   Total Protein 7.2 6.0 - 8.3 g/dL   Albumin 4.6 3.5 - 5.2 g/dL   GFR 11.79 >39.99 mL/min   Calcium 9.3 8.4 - 10.5 mg/dL     COVID 19 screen:  No recent travel or known exposure to COVID19 The patient denies respiratory symptoms of COVID 19 at this time. The importance of social distancing was discussed today.   Assessment and Plan The patient's preventative maintenance and recommended screening tests for an annual wellness exam were reviewed in full today. Brought up to date unless services declined.  Counselled on the importance of diet, exercise, and its role in overall health and mortality. The patient's FH and SH was reviewed, including their home life, tobacco status, and drug and alcohol status.   Vaccines: Tdap uptodate, COVID x 3, consider shingrix  and flu in fall PAP/DVE:  Per GYN, 07/2021   negative Mammo:  per GYN 07/29/2022... just had at [facility] Colon:  2019, repeat 10 year  Smoking Status: smoker 1/2 pack per day.. Chantix .. had HA and  nightmares with Chantix  in past, SE  of hive to wellbutrin. Spent 5 minutes discussing cessation techniques, plan 10 year. Had night issues.SABRA willing  to try daily dose.  25 pack year history.. interested in lung cancer screening program ETOH/ drug ldz:wnwz/wnwz  Hep C:due   Problem List Items Addressed This Visit     Prediabetes   Encouraged exercise, weight loss, healthy eating habits.       Tobacco abuse    Chronic, greater than 25-pack-year history, current smoker.  Contemplative about cessation.  Did not tolerate Chantix  high dose.  Recommended trial of 1 mg or even 0.5 mg in the morning to avoid nighttime side effects.  Referral placed for lung cancer screening program.      Relevant Orders   Ambulatory Referral Lung Cancer Screening [facility]   Other Visit Diagnoses       Routine general medical examination at a health care facility    -  Primary     Need for vaccination against Streptococcus pneumoniae       Relevant Orders   Pneumococcal conjugate vaccine 20-valent (Completed)       Meds ordered this encounter  Medications   varenicline  (CHANTIX ) 1 MG tablet    Sig: Take 1 tablet (1 mg total) by mouth daily.    Dispense:  30 tablet  Refill:  3   Orders Placed This Encounter  Procedures   Pneumococcal conjugate vaccine 20-valent   Ambulatory Referral Lung Cancer Screening [facility]    Referral Priority:   Routine    Referral Type:   Consultation    Referral Reason:   Specialty Services Required    Number of Visits Requested:   1    Greig Ring, MD

## 2023-10-01 ENCOUNTER — Ambulatory Visit: Payer: Self-pay | Admitting: Family Medicine

## 2023-11-11 ENCOUNTER — Other Ambulatory Visit: Payer: Self-pay | Admitting: Family Medicine

## 2023-12-25 ENCOUNTER — Other Ambulatory Visit: Payer: Self-pay | Admitting: Family Medicine

## 2023-12-27 NOTE — Telephone Encounter (Signed)
 Last office visit 09/30/2023 for CPE.  Last refilled 09/30/2023 for #30 with 3 refills.  Next Appt: No future appointments.  Pharmacy is requesting 90 day supply.
